# Patient Record
Sex: Male | Born: 2000 | Race: White | Hispanic: No | Marital: Single | Smoking: Never smoker
Health system: Southern US, Community
[De-identification: ages and names within clinical notes are randomized; demographics above are authoritative.]

## PROBLEM LIST (undated history)

## (undated) DIAGNOSIS — F909 Attention-deficit hyperactivity disorder, unspecified type: Secondary | ICD-10-CM

## (undated) DIAGNOSIS — F419 Anxiety disorder, unspecified: Secondary | ICD-10-CM

## (undated) DIAGNOSIS — I10 Essential (primary) hypertension: Secondary | ICD-10-CM

## (undated) DIAGNOSIS — Z8489 Family history of other specified conditions: Secondary | ICD-10-CM

## (undated) DIAGNOSIS — G4730 Sleep apnea, unspecified: Secondary | ICD-10-CM

## (undated) DIAGNOSIS — J45909 Unspecified asthma, uncomplicated: Secondary | ICD-10-CM

## (undated) HISTORY — DX: Anxiety disorder, unspecified: F41.9

## (undated) HISTORY — DX: Essential (primary) hypertension: I10

## (undated) HISTORY — DX: Attention-deficit hyperactivity disorder, unspecified type: F90.9

## (undated) HISTORY — PX: OTHER SURGICAL HISTORY: SHX169

---
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
## 2020-01-09 ENCOUNTER — Ambulatory Visit: Payer: BC Managed Care – PPO | Admitting: Psychiatry

## 2020-01-17 ENCOUNTER — Ambulatory Visit (INDEPENDENT_AMBULATORY_CARE_PROVIDER_SITE_OTHER): Payer: BC Managed Care – PPO | Admitting: Psychiatry

## 2020-01-17 ENCOUNTER — Encounter: Payer: Self-pay | Admitting: Psychiatry

## 2020-01-17 ENCOUNTER — Other Ambulatory Visit: Payer: Self-pay

## 2020-01-17 VITALS — Ht 71.0 in | Wt 250.0 lb

## 2020-01-17 DIAGNOSIS — F324 Major depressive disorder, single episode, in partial remission: Secondary | ICD-10-CM | POA: Diagnosis not present

## 2020-01-17 DIAGNOSIS — F4010 Social phobia, unspecified: Secondary | ICD-10-CM

## 2020-01-17 DIAGNOSIS — F800 Phonological disorder: Secondary | ICD-10-CM

## 2020-01-17 DIAGNOSIS — F902 Attention-deficit hyperactivity disorder, combined type: Secondary | ICD-10-CM | POA: Diagnosis not present

## 2020-01-17 DIAGNOSIS — F422 Mixed obsessional thoughts and acts: Secondary | ICD-10-CM | POA: Diagnosis not present

## 2020-01-17 DIAGNOSIS — F82 Specific developmental disorder of motor function: Secondary | ICD-10-CM

## 2020-01-17 MED ORDER — DEXMETHYLPHENIDATE HCL ER 40 MG PO CP24: 40.0000 mg | ORAL_CAPSULE | Freq: Every day | ORAL | 0 refills | Status: DC

## 2020-01-17 MED ORDER — CLONIDINE HCL 0.2 MG PO TABS: 0.2000 mg | ORAL_TABLET | Freq: Every day | ORAL | 0 refills | Status: DC

## 2020-01-17 MED ORDER — ESCITALOPRAM OXALATE 20 MG PO TABS: 30.0000 mg | ORAL_TABLET | Freq: Every day | ORAL | 0 refills | Status: DC

## 2020-01-17 NOTE — Patient Instructions (Signed)
Discontinue Tlilafon (perphenazine) 4 mg nightly

## 2020-01-17 NOTE — Progress Notes (Signed)
[facility]  Patient ID: Richard Kelley,  MRN: [id number]  PCP: Assunta Found, MD  Date of Evaluation: 01/17/2020 Time spent:25 minutes from 1410 to 1435  Chief Complaint:  Chief Complaint    Anxiety; ADHD; Depression      HISTORY/CURRENT STATUS: Richard Kelley is seen onsite in office 25 minutes face-to-face individually and conjointly with mother arriving 10 minutes early with consent with epic collateral for adolescent psychiatric interview and exam in 53-month evaluation and management of social anxiety, OCD/ADHD, depression now partially remitted and speech articulation and coordination limitations.  Mother states the patient is adjusting successfully to not going back to school but taking the semester off this winter and spring and will consider restarting RCC in August.  He is comfortable off of school, expecting he will need to resume his psychotherapy when he restarts school last therapy session with [name], LCSW on January 7 overdue to return allowing me to suggest that he resume before returning to school in order to prepare.  His weight gain must consider stopping the Trilafon for suicidal depression of late last fall and early winter which has actually subsided including risk of self-harm now resolved.  He is having some difficulty initiating sleep even with Trilafon so that as we discontinue it. He needs to be restarted on clonidine, needing more than the 0.1 mg to work in the past.  He is not taking the Focalin significantly though last dispensedper [place] registry 11/30/2019 as the second eScription from 11/0/2020.  He still has trouble sleeping.  He has few responsibilities now, but he and mother are both receiving more care in this office, mother seeing [name].  Brother is growing taller than Shelby now who has been at 71 inches or 72 with shoes for the last couple of years, gaining from 210 to 250 pounds now.  He has no mania, suicidality, psychosis or delirium.  He is learning mostly  from experience.  However he did not get his learner's permit renewed so that he has no time or experience coded to get his driver's license permit to again becomes motivated to do so.  Anxiety Presents forfollow-upvisit. Symptoms includeovereating, compulsions,decreased concentration,excessive worry,insomnia,muscle tension,nervous/anxious behavior, confusion,avoidance, inhibition,and obsessions. Patient reports nohyperventilation,chest pain, depressed mood,dizziness,dry mouth, derealization,palpitations, feeling of choking,irritability,panic,shortness of breathor suicidal ideas. Symptoms occurmost days. The severity of symptoms isinterfering with daily activities and moderate. The quality of sleep ispoor. Nighttime awakenings:several. Compliance with medications is76-100%. Side effects of treatment includejoint pain.  Individual Medical History/ Review of Systems: Changes? :Yes weight gain of 25 pounds in 18 months mother inactivity, compulsive eating, or Trilafon.  Allergies: Patient has no known allergies.  Current Medications:  Current Outpatient Medications:  .  cloNIDine (CATAPRES) 0.2 MG tablet, Take 1 tablet (0.2 mg total) by mouth at bedtime., Disp: 90 tablet, Rfl: 0 .  Dexmethylphenidate HCl 40 MG CP24, Take 1 capsule (40 mg total) by mouth daily after breakfast., Disp: 30 capsule, Rfl: 0 .  [START ON 02/16/2020] Dexmethylphenidate HCl 40 MG CP24, Take 1 capsule (40 mg total) by mouth daily after breakfast., Disp: 30 capsule, Rfl: 0 .  [START ON 03/17/2020] Dexmethylphenidate HCl 40 MG CP24, Take 1 capsule (40 mg total) by mouth daily after breakfast., Disp: 30 capsule, Rfl: 0 .  escitalopram (LEXAPRO) 20 MG tablet, Take 1.5 tablets (30 mg total) by mouth daily after breakfast., Disp: 135 tablet, Rfl: 0  Medication Side Effects: weight gain  Family Medical/ Social History: Changes? No  MENTAL HEALTH EXAM:  Height  5\' 11"  (1.803 m), weight 250 lb  (113.4 kg).Body mass index is 34.87 kg/m. Muscle strengths and tone 5/5, postural reflexes and gait 0/0, and AIMS = 0.  General Appearance: Casual, Fairly Groomed, Guarded, Meticulous and Obese  Eye Contact:  Fair  Speech:  Clear and Coherent, Garbled, Normal Rate and Talkative  Volume:  Normal  Mood:  Anxious, Dysphoric, Euthymic and Worthless  Affect:  Congruent, Inappropriate, Restricted and Anxious  Thought Process:  Coherent, Goal Directed, Irrelevant, Linear and Descriptions of Associations: Circumstantial  Orientation:  Full (Time, Place, and Person)  Thought Content: Ilusions, Obsessions and Rumination   Suicidal Thoughts:  No  Homicidal Thoughts:  No  Memory:  Immediate;   Good Remote;   Good  Judgement:  Fair  Insight:  Fair and Lacking  Psychomotor Activity:  Normal and Mannerisms  Concentration:  Concentration: Fair and Attention Span: Fair  Recall:  [employer] of Knowledge: Good  Language: Good  Assets:  Desire for Improvement Leisure Time Resilience Talents/Skills  ADL's:  Intact  Cognition: WNL  Prognosis:  Fair    DIAGNOSES:    ICD-10-CM   1. Mixed obsessional thoughts and acts  F42.2 escitalopram (LEXAPRO) 20 MG tablet  2. Social anxiety disorder  F40.10 escitalopram (LEXAPRO) 20 MG tablet  3. Attention deficit hyperactivity disorder, combined type  F90.2 cloNIDine (CATAPRES) 0.2 MG tablet    Dexmethylphenidate HCl 40 MG CP24    Dexmethylphenidate HCl 40 MG CP24    Dexmethylphenidate HCl 40 MG CP24  4. Major depression single episode, in partial remission ([place])  F32.4   5. 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  With weight gain and global improvement, Trilafon  is stopped 4 mg nightly though he suggests he is not taking any medication regularly currently.  The discontinued Trilafon is replaced for obsessional anxiety with an increased dose of Lexapro 20 mg tablet taking 1.5 tablets total 30 mg every morning #135 tablets no refill sent to [facility] on [address].  We will restart his clonidine at the double dose of 0.2 mg every bedtime for insomnia symptoms #90 with no refill to [facility] on [address].  He is E scribed the Focalin 40 mg XR every morning last in [place] registry 11/30/2019 as second of the escriptions from 07/12/2019 as #30 with no refill for to [facility] in [place] on [address] each for May 20, June 19, and July 19.  Psychosupportive psychoeducation reworks prevention and monitoring and safety hygiene integrated with cognitive behavioral goals for symptom treatment matching.  He returns for follow-up in 3 to 4 months or sooner if needed as mother explains to him today the hereditary patterns in the family for such symptom formation.   Delight Hoh, MD

## 2020-01-31 DIAGNOSIS — Z0189 Encounter for other specified special examinations: Secondary | ICD-10-CM | POA: Diagnosis not present

## 2020-01-31 DIAGNOSIS — F4010 Social phobia, unspecified: Secondary | ICD-10-CM | POA: Diagnosis not present

## 2020-01-31 DIAGNOSIS — F902 Attention-deficit hyperactivity disorder, combined type: Secondary | ICD-10-CM | POA: Diagnosis not present

## 2020-01-31 DIAGNOSIS — F422 Mixed obsessional thoughts and acts: Secondary | ICD-10-CM | POA: Diagnosis not present

## 2020-02-01 DIAGNOSIS — Z0000 Encounter for general adult medical examination without abnormal findings: Secondary | ICD-10-CM | POA: Diagnosis not present

## 2020-02-06 DIAGNOSIS — Z0001 Encounter for general adult medical examination with abnormal findings: Secondary | ICD-10-CM | POA: Diagnosis not present

## 2020-02-20 DIAGNOSIS — J343 Hypertrophy of nasal turbinates: Secondary | ICD-10-CM | POA: Diagnosis not present

## 2020-02-20 DIAGNOSIS — J310 Chronic rhinitis: Secondary | ICD-10-CM | POA: Diagnosis not present

## 2020-02-20 DIAGNOSIS — J342 Deviated nasal septum: Secondary | ICD-10-CM | POA: Diagnosis not present

## 2020-02-20 DIAGNOSIS — G4733 Obstructive sleep apnea (adult) (pediatric): Secondary | ICD-10-CM | POA: Diagnosis not present

## 2020-02-20 DIAGNOSIS — R040 Epistaxis: Secondary | ICD-10-CM | POA: Diagnosis not present

## 2020-02-21 DIAGNOSIS — G4459 Other complicated headache syndrome: Secondary | ICD-10-CM | POA: Diagnosis not present

## 2020-02-26 ENCOUNTER — Other Ambulatory Visit (HOSPITAL_COMMUNITY): Payer: Self-pay | Admitting: Internal Medicine

## 2020-02-26 ENCOUNTER — Other Ambulatory Visit: Payer: Self-pay | Admitting: Internal Medicine

## 2020-02-26 DIAGNOSIS — G4459 Other complicated headache syndrome: Secondary | ICD-10-CM

## 2020-02-27 ENCOUNTER — Ambulatory Visit (HOSPITAL_COMMUNITY): Admission: RE | Admit: 2020-02-27 | Payer: BC Managed Care – PPO | Source: Ambulatory Visit

## 2020-02-28 ENCOUNTER — Ambulatory Visit (HOSPITAL_COMMUNITY)
Admission: RE | Admit: 2020-02-28 | Discharge: 2020-02-28 | Disposition: A | Discharge status: Home / Self Care | Payer: BC Managed Care – PPO | Source: Ambulatory Visit | Attending: Internal Medicine | Admitting: Internal Medicine

## 2020-02-28 ENCOUNTER — Other Ambulatory Visit: Payer: Self-pay

## 2020-02-28 DIAGNOSIS — G4459 Other complicated headache syndrome: Secondary | ICD-10-CM | POA: Insufficient documentation

## 2020-02-28 DIAGNOSIS — R519 Headache, unspecified: Secondary | ICD-10-CM | POA: Diagnosis not present

## 2020-03-19 ENCOUNTER — Ambulatory Visit (HOSPITAL_COMMUNITY): Payer: Managed Care, Other (non HMO)

## 2020-04-03 DIAGNOSIS — G4719 Other hypersomnia: Secondary | ICD-10-CM | POA: Diagnosis not present

## 2020-04-16 DIAGNOSIS — R000 Tachycardia, unspecified: Secondary | ICD-10-CM | POA: Diagnosis not present

## 2020-04-17 ENCOUNTER — Encounter: Payer: Self-pay | Admitting: Cardiology

## 2020-04-17 DIAGNOSIS — R000 Tachycardia, unspecified: Secondary | ICD-10-CM | POA: Diagnosis not present

## 2020-04-17 DIAGNOSIS — R002 Palpitations: Secondary | ICD-10-CM | POA: Diagnosis not present

## 2020-04-22 ENCOUNTER — Telehealth: Payer: Self-pay

## 2020-04-22 DIAGNOSIS — J343 Hypertrophy of nasal turbinates: Secondary | ICD-10-CM | POA: Diagnosis not present

## 2020-04-22 DIAGNOSIS — J310 Chronic rhinitis: Secondary | ICD-10-CM | POA: Diagnosis not present

## 2020-04-22 DIAGNOSIS — J342 Deviated nasal septum: Secondary | ICD-10-CM | POA: Diagnosis not present

## 2020-04-22 NOTE — Telephone Encounter (Signed)
Prior authorization submitted and approved for DEXMETHYLPHENIDATE ER 40 MG CAPSULES effective 04/22/2020-04/22/2023 with [employer].

## 2020-04-25 ENCOUNTER — Other Ambulatory Visit (HOSPITAL_COMMUNITY): Payer: Self-pay | Admitting: Internal Medicine

## 2020-04-25 ENCOUNTER — Other Ambulatory Visit: Payer: Self-pay | Admitting: Internal Medicine

## 2020-05-19 ENCOUNTER — Ambulatory Visit: Payer: BC Managed Care – PPO | Admitting: Psychiatry

## 2020-05-19 ENCOUNTER — Encounter: Payer: Self-pay | Admitting: *Deleted

## 2020-05-20 ENCOUNTER — Ambulatory Visit (INDEPENDENT_AMBULATORY_CARE_PROVIDER_SITE_OTHER): Payer: BC Managed Care – PPO

## 2020-05-20 ENCOUNTER — Ambulatory Visit: Payer: BC Managed Care – PPO | Admitting: Cardiology

## 2020-05-20 ENCOUNTER — Encounter: Payer: Self-pay | Admitting: *Deleted

## 2020-05-20 ENCOUNTER — Telehealth: Payer: Self-pay | Admitting: Cardiology

## 2020-05-20 ENCOUNTER — Encounter: Payer: Self-pay | Admitting: Cardiology

## 2020-05-20 VITALS — BP 122/78 | HR 102 | Ht 71.0 in | Wt 256.0 lb

## 2020-05-20 DIAGNOSIS — R000 Tachycardia, unspecified: Secondary | ICD-10-CM | POA: Diagnosis not present

## 2020-05-20 DIAGNOSIS — R0602 Shortness of breath: Secondary | ICD-10-CM

## 2020-05-20 NOTE — Progress Notes (Signed)
Cardiology Office Note  Date: 05/20/2020   ID: Richard Kelley, DOB March 09, 2001, MRN [id number]  PCP:  Benita Stabile, MD  Cardiologist:  Nona Dell, MD Electrophysiologist:  None   Chief Complaint  Patient presents with   Shortness of Breath   Elevated heart rate    History of Present Illness: Richard Kelley is a 19 y.o. male referred for cardiology consultation by Dr. Margo Aye for evaluation of elevated heart rate and history of hypertension.  I reviewed the available records.  He is here today with his mother.  He has a history of ADHD and anxiety, currently followed by a psychiatrist and is on dexmethylphenidate as well as Lexapro, listed below.  Over the last few years he states that he has been told his heart rate is elevated when his blood pressure has been checked, although he does not report any definite sense of palpitations.  He does get short of breath when he exerts himself sometimes and has to rest, such as walking less than 100 yards.  He has had no syncope.  He is on clonidine not for treatment of hypertension, but to help him sleep per discussion today.  He also reports snoring, has been assessed by an ENT with documented a deviated septum and plan for potential surgical correction.  Also pending formal sleep study although this is on hold at this time.  He does not have any history of congenital heart disease, his mother tells me that there was initial concern that he might have a "hole in the heart," however this was not found to be the case by ultrasound imaging.  I reviewed lab work that he had done back in June, hemoglobin normal, renal function and potassium normal, TSH normal.  Orthostatic measurements made today did not show diagnostic hypotension, nor was there a significant enough increase in heart rate to diagnose POTS (only 20 point increase as opposed to a 30-40 point increase which would be diagnostic at this age).   Past Medical History:  Diagnosis Date    ADHD    Anxiety    Hypertension     Past Surgical History:  Procedure Laterality Date   No prior surgery      Current Outpatient Medications  Medication Sig Dispense Refill   cloNIDine (CATAPRES) 0.2 MG tablet Take 0.2 mg by mouth. DOESN'T TAKE REGULARLY     Dexmethylphenidate HCl 40 MG CP24 Take 1 capsule (40 mg total) by mouth daily after breakfast. 30 capsule 0   escitalopram (LEXAPRO) 20 MG tablet Take 20 mg by mouth daily.     No current facility-administered medications for this visit.   Allergies:  Patient has no known allergies.   Social History: The patient  reports that he has never smoked. He has never used smokeless tobacco. He reports that he does not drink alcohol and does not use drugs.   Family History: The patient's family history includes Hypertension in his maternal grandmother; Irregular heart beat in his father and mother.   ROS:  No orthopnea or PND, no leg swelling.  Physical Exam: VS:  BP 122/78    Pulse (!) 102    Ht 5\' 11"  (1.803 m)    Wt 256 lb (116.1 kg)    SpO2 98%    BMI 35.70 kg/m , BMI Body mass index is 35.7 kg/m.  Wt Readings from Last 3 Encounters:  05/20/20 256 lb (116.1 kg) (>99 %, Z= 2.51)*   * Growth percentiles are based  on CDC (Boys, 2-20 Years) data.    General: Obese male, appears comfortable at rest. HEENT: Conjunctiva and lids normal, no mass. Neck: Supple, no elevated JVP or carotid bruits, no thyromegaly. Lungs: Clear to auscultation, nonlabored breathing at rest. Cardiac: Regular rate and rhythm, no S3 or significant systolic murmur, no pericardial rub. Abdomen: Soft, nontender, bowel sounds present. Extremities: No pitting edema, distal pulses 2+. Skin: Warm and dry. Musculoskeletal: No kyphosis. Neuropsychiatric: Alert and oriented x3, affect grossly appropriate.  ECG:  An ECG dated 04/17/2020 was personally reviewed today and demonstrated:  Sinus tachycardia with normal intervals.  Recent Labwork:  June 2021:  Hgb 17.0, platelets 316, BUN 8, creatinine 0.92, potassium 4.2, AST 26, ALT 39, LDL 119, HDL 38, TG 162, cholesterol 186, T4 1.26, TSH 1.470  Other Studies Reviewed Today:  No prior cardiac testing for review today.  Assessment and Plan:  1.  Elevated heart rate, sinus tachycardia by ECG.  He does not report any sense of palpitations.  Does experience dyspnea on exertion at times, no orthopnea or PND.  Lab work from June showed normal hemoglobin, normal renal function and potassium, normal TSH.  Orthostatic measurements do not meet diagnostic criteria for POTS at this time.  It could be that his current medications, specifically dexmethylphenidate and Lexapro, contribute to his elevated heart rate and perhaps fluctuations in blood pressure as well.  Today's blood pressure is normal.  To investigate further we will plan an echocardiogram to ensure normal cardiac structure and function, obtain a 24-hour cardiac monitor to more clearly define heart rate variability and exclude persistent tachycardia, also a 24-hour urine collection for metanephrines and normetanephrines.  2.  ADHD and anxiety, currently on dexmethylphenidate and Lexapro with follow-up by psychiatrist.  Medication Adjustments/Labs and Tests Ordered: Current medicines are reviewed at length with the patient today.  Concerns regarding medicines are outlined above.   Tests Ordered: Orders Placed This Encounter  Procedures   Metanephrines, urine, 24 hour   LONG TERM MONITOR (3-14 DAYS)   ECHOCARDIOGRAM COMPLETE    Medication Changes: No orders of the defined types were placed in this encounter.   Disposition:  Follow up test results.  Signed, Jonelle Sidle, MD, [facility] 05/20/2020 11:31 AM    [facility] [address] [place], [place], [place] [address] Phone: [phone]; Fax: [phone]

## 2020-05-20 NOTE — Telephone Encounter (Signed)
Pre-cert Verification for the following procedure    ZIO MONITOR   Echo scheduled for 05-21-2020 at [facility]

## 2020-05-20 NOTE — Patient Instructions (Addendum)
Medication Instructions:   Your physician recommends that you continue on your current medications as directed. Please refer to the Current Medication list given to you today.  Labwork:  Your physician recommends that you return for lab work in: as soon as possible to check a 24 hour urine metanephrines (normetaneprhines). This may be done at [facility] or [employer] [facility] ([address] [place]) Monday-Friday from 8:00 am - 4:00 pm. No appointment is needed.  Testing/Procedures: Your physician has requested that you have an echocardiogram. Echocardiography is a painless test that uses sound waves to create images of your heart. It provides your doctor with information about the size and shape of your heart and how well your heart's chambers and valves are working. This procedure takes approximately one hour. There are no restrictions for this procedure. Your physician has recommended that you wear a holter monitor for 24 hours. Holter monitors are medical devices that record the heart's electrical activity. Doctors most often use these monitors to diagnose arrhythmias. Arrhythmias are problems with the speed or rhythm of the heartbeat. The monitor is a small, portable device. You can wear one while you do your normal daily activities. This is usually used to diagnose what is causing palpitations/syncope (passing out).   Follow-Up:  Your physician recommends that you schedule a follow-up appointment in: pending.   Any Other Special Instructions Will Be Listed Below (If Applicable).  If you need a refill on your cardiac medications before your next appointment, please call your pharmacy.

## 2020-05-21 ENCOUNTER — Ambulatory Visit (INDEPENDENT_AMBULATORY_CARE_PROVIDER_SITE_OTHER): Payer: BC Managed Care – PPO

## 2020-05-21 DIAGNOSIS — R0602 Shortness of breath: Secondary | ICD-10-CM | POA: Diagnosis not present

## 2020-05-21 LAB — ECHOCARDIOGRAM COMPLETE
Area-P 1/2: 4.17 cm2
Calc EF: 61.7 %
MV M vel: 1.98 m/s
MV Peak grad: 15.7 mmHg
S' Lateral: 2.64 cm
Single Plane A2C EF: 59.2 %
Single Plane A4C EF: 64.2 %

## 2020-05-22 ENCOUNTER — Ambulatory Visit (INDEPENDENT_AMBULATORY_CARE_PROVIDER_SITE_OTHER): Payer: BC Managed Care – PPO | Admitting: Psychiatry

## 2020-05-22 ENCOUNTER — Encounter: Payer: Self-pay | Admitting: Psychiatry

## 2020-05-22 ENCOUNTER — Other Ambulatory Visit: Payer: Self-pay

## 2020-05-22 VITALS — Ht 71.0 in | Wt 250.0 lb

## 2020-05-22 DIAGNOSIS — F324 Major depressive disorder, single episode, in partial remission: Secondary | ICD-10-CM | POA: Diagnosis not present

## 2020-05-22 DIAGNOSIS — F902 Attention-deficit hyperactivity disorder, combined type: Secondary | ICD-10-CM

## 2020-05-22 DIAGNOSIS — F4010 Social phobia, unspecified: Secondary | ICD-10-CM | POA: Diagnosis not present

## 2020-05-22 DIAGNOSIS — F800 Phonological disorder: Secondary | ICD-10-CM

## 2020-05-22 DIAGNOSIS — F422 Mixed obsessional thoughts and acts: Secondary | ICD-10-CM

## 2020-05-22 DIAGNOSIS — F82 Specific developmental disorder of motor function: Secondary | ICD-10-CM

## 2020-05-22 MED ORDER — CLONIDINE HCL 0.2 MG PO TABS: 0.2000 mg | ORAL_TABLET | Freq: Every day | ORAL | 1 refills | Status: DC

## 2020-05-22 MED ORDER — DEXMETHYLPHENIDATE HCL ER 30 MG PO CP24: 30.0000 mg | ORAL_CAPSULE | Freq: Every day | ORAL | 0 refills | Status: DC

## 2020-05-22 MED ORDER — ESCITALOPRAM OXALATE 20 MG PO TABS: 20.0000 mg | ORAL_TABLET | Freq: Every day | ORAL | 1 refills | Status: DC

## 2020-05-22 NOTE — Progress Notes (Signed)
[facility]  Patient ID: Richard Kelley,  MRN: [id number]  PCP: Richard Stabile, MD  Date of Evaluation: 05/22/2020 Time spent:25 minutes from 1505 to 1530  Chief Complaint:  Chief Complaint    Anxiety; Paranoid; ADHD; Depression      HISTORY/CURRENT STATUS: Richard Kelley is seen onsite in office 25 minutes face-to-face individually with consent with epic collateral arriving 20 minutes early therefore seen early as the person before him was late mother having an appointment with Richard Kelley at the same time not participating with Richard Kelley as they wish him to become more individuated for psychiatric interview and exam in 24-month evaluation and management of OCD/ADHD, social anxiety, single episode previously suicidal major depression in partial remission, and speech sound and developmental coordination disorders.  The patient reports he is doing well but he attributes that to taking last semester off RCC currently only gradually reinstating by completing equivalence certificates for courses on website.  He feels no need for therapy after 4 sessions with Richard Shutter, LCSW last 09/12/2019.  Depression improved with Trilafon 4 mg and Lexapro 20 mg tapering and then discontinuing Trilafon by last appointment 01/17/2020 for weight gain and less initiative. He still does not help mother with chores even off Trilafon.  He continues clonidine at bedtime at increased dose of 0.2 mg and Lexapro 20 mg every morning.  He requires that Focalin will be reduced from 40 to 30 mg as he cannot sleep well if he takes the Focalin later in the morning at 40 mg dose therefore he will just skip the dose.  He is in the middle of rather extensive medical work-ups to see what treatment is needed for resting tachycardia stating his most normal heart rate is when he is angry enough to do something about it.  He notes the cardiologist is clarifying that all of the patient's medications could speed up his heart rate. Echocardiogram is normal  and results of Holter for 24 hours are pending.  ENT is considering nasal septal surgery and he will have a sleep study.  Richard Kelley registry documents last Focalin 40 mg XR dispensed 04/23/2020 from escription 11/30/2019 soon to expire.  Previous consideration of social communication disorder or autism diagnoses have in the interim no further reason for pursuing as he is considered to function more normally when stress is low.  For my upcoming imminent retirement in 3 months, he may possibly see Richard Kelley as mother sees Richard Kelley.   Anxiety Presents forfollow-upvisit. Symptoms includeremitting dysphoria, overeating, compulsive rituals,decreased concentration,excessive worry,insomnia,muscle tension,nervous/anxious behavior, confusion,avoidance, inhibition,and obsessions. Patient reports nohyperventilation,chest pain,depressed mood,dizziness,dry mouth, derealization,palpitations,feeling of choking,irritability,panic,shortness of breathor suicidal ideas. Symptoms occurmost days. The severity of symptoms isinterfering with daily activities and moderate. The quality of sleep ispoor. Nighttime awakenings:several. Compliance with medications is76-100%. Side effects of treatment includejoint pain.  Individual Medical History/ Review of Systems: Changes? :Yes   Allergies: Patient has no known allergies.  Current Medications:  Current Outpatient Medications:  .  cloNIDine (CATAPRES) 0.2 MG tablet, Take 1 tablet (0.2 mg total) by mouth at bedtime. DOESN'T TAKE REGULARLY, Disp: 90 tablet, Rfl: 1 .  Dexmethylphenidate HCl 30 MG CP24, Take 1 capsule (30 mg total) by mouth daily after breakfast., Disp: 30 capsule, Rfl: 0 .  [START ON 06/21/2020] Dexmethylphenidate HCl 30 MG CP24, Take 1 capsule (30 mg total) by mouth daily after breakfast., Disp: 30 capsule, Rfl: 0 .  [START ON 07/21/2020] Dexmethylphenidate HCl 30 MG CP24, Take 1 capsule (30 mg total) by mouth daily after breakfast.,  Disp:  30 capsule, Rfl: 0 .  escitalopram (LEXAPRO) 20 MG tablet, Take 1 tablet (20 mg total) by mouth daily after breakfast., Disp: 90 tablet, Rfl: 1  Medication Side Effects: weight gain and tachycardia  Family Medical/ Social History: Changes? No  MENTAL HEALTH EXAM:  Height 5\' 11"  (1.803 m), weight 250 lb (113.4 kg).Body mass index is 34.87 kg/m. Muscle strengths and tone 5/5, postural reflexes and gait 0/0, and AIMS = 0.  General Appearance: Casual, Fairly Groomed, Guarded, Meticulous and Obese  Eye Contact:  Fair  Speech:  Clear and Coherent, Garbled, Normal Rate and Talkative  Volume:  Normal  Mood:  Anxious, Euthymic and Worthless  Affect:  Congruent, Inappropriate, Restricted and Anxious  Thought Process:  Coherent, Goal Directed, Irrelevant, Linear and Descriptions of Associations: Circumstantial  Orientation:  Full (Time, Place, and Person)  Thought Content: Ilusions, Obsessions and Rumination   Suicidal Thoughts:  No  Homicidal Thoughts:  No  Memory:  Immediate;   Good Remote;   Good  Judgement:  Fair  Insight:  Fair and Lacking  Psychomotor Activity:  Normal, Decreased and Mannerisms  Concentration:  Concentration: Fair and Attention Span: Fair  Recall:  of Knowledge: Good  Language: Fair  Assets:  Desire for Improvement Leisure Time Resilience Talents/Skills  ADL's:  Intact  Cognition: WNL  Prognosis:  Fair    DIAGNOSES:    ICD-10-CM   1. Mixed obsessional thoughts and acts  F42.2 escitalopram (LEXAPRO) 20 MG tablet  2. Social anxiety disorder  F40.10 escitalopram (LEXAPRO) 20 MG tablet  3. Attention deficit hyperactivity disorder, combined type  F90.2 Dexmethylphenidate HCl 30 MG CP24    Dexmethylphenidate HCl 30 MG CP24    Dexmethylphenidate HCl 30 MG CP24    cloNIDine (CATAPRES) 0.2 MG tablet  4. 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  Developmental coordination disorder  F82 cloNIDine (CATAPRES) 0.2 MG tablet    Receiving Psychotherapy: No    RECOMMENDATIONS: Patient without mother concludes to reduce Focalin but maintain Lexapro and clonidine without therapy as he actually establishes some academic and family responsibilities.  Focalin 40 mg is reduced to 30 mg XR capsule every morning meal for September 23, October 23, and November 22 sent to November [address] on [employer] for ADHD.  Lexapro is currently 20 mg every morning after breakfast sent as #90 with 1 refill to [facility] on [address] for anxiety, OCD, and depression.  He is E scribed clonidine 0.2 mg every bedtime #90 with 1 refill for insomnia associated with anxiety as well as ADHD sent to [facility] on [address].  Follow-up has been every 4 months or sooner if needed with closure of my care for imminent retirement to see advanced practitioner for next session.    [employer], MD

## 2020-05-23 ENCOUNTER — Telehealth: Payer: Self-pay | Admitting: *Deleted

## 2020-05-23 NOTE — Telephone Encounter (Signed)
-----   Message from Jonelle Sidle, MD sent at 05/21/2020  1:59 PM EDT ----- Results reviewed.  Echocardiogram is reassuring with normal LVEF at 65 to 70%, no evidence of cardiomyopathy which was our main question with elevated heart rate.  No significant valvular abnormalities.

## 2020-05-23 NOTE — Telephone Encounter (Signed)
Patient's mom [facility] [place]) informed. Copy sent to PCP

## 2020-05-27 DIAGNOSIS — R0602 Shortness of breath: Secondary | ICD-10-CM | POA: Diagnosis not present

## 2020-05-27 DIAGNOSIS — R000 Tachycardia, unspecified: Secondary | ICD-10-CM | POA: Diagnosis not present

## 2020-05-29 ENCOUNTER — Telehealth: Payer: Self-pay | Admitting: *Deleted

## 2020-05-29 NOTE — Telephone Encounter (Signed)
Patient mom [facility] [place]) informed and verbalized understanding of plan. Copy sent to PCP

## 2020-05-29 NOTE — Telephone Encounter (Signed)
-----   Message from Jonelle Sidle, MD sent at 05/29/2020  1:22 PM EDT ----- Results reviewed.  Cardiac monitor shows reduction in heart rate below 100 predominantly during early morning hours down to a low of 61 bpm.  Highest heart rates are during the daytime while awake.  This excludes persistent sinus tachycardia which was the main question.  No significant arrhythmias were noted.

## 2020-05-30 LAB — METANEPHRINES, URINE, 24 HOUR
Metaneph Total, Ur: 1044 mcg/24 h — ABNORMAL HIGH (ref 94–604)
Metanephrines, Ur: 223 mcg/24 h — ABNORMAL HIGH (ref 25–222)
Normetanephrine, 24H Ur: 821 mcg/24 h — ABNORMAL HIGH (ref 40–412)
Volume, Urine-VMAUR: 1350 mL

## 2020-06-02 ENCOUNTER — Other Ambulatory Visit: Payer: Self-pay

## 2020-06-02 DIAGNOSIS — R000 Tachycardia, unspecified: Secondary | ICD-10-CM

## 2020-06-03 DIAGNOSIS — R000 Tachycardia, unspecified: Secondary | ICD-10-CM | POA: Diagnosis not present

## 2020-06-18 ENCOUNTER — Encounter: Payer: Self-pay | Admitting: Psychiatry

## 2020-06-18 ENCOUNTER — Other Ambulatory Visit: Payer: BC Managed Care – PPO

## 2020-06-24 ENCOUNTER — Telehealth: Payer: Self-pay | Admitting: *Deleted

## 2020-06-24 NOTE — Telephone Encounter (Signed)
-----   Message from Jonelle Sidle, MD sent at 06/24/2020 10:44 AM EDT ----- Results reviewed.  Plasma normetanephrine and metanephrine levels are within normal range.  As noted previously Chromogranin A level was also normal.  This is reassuring and argues against anything unusual like a neuroendocrine tumor causing increasing heart rate.  We already reviewed his echocardiogram and cardiac monitor, also reassuring.  Would not anticipate any further cardiac work-up at this time.  Keep follow-up with PCP and behavioral health, regular exercise plan would also be beneficial.

## 2020-06-24 NOTE — Telephone Encounter (Signed)
Patient's mom informed. Copy sent to PCP

## 2020-09-18 ENCOUNTER — Telehealth (INDEPENDENT_AMBULATORY_CARE_PROVIDER_SITE_OTHER): Payer: BC Managed Care – PPO | Admitting: Physician Assistant

## 2020-09-18 ENCOUNTER — Telehealth: Payer: Self-pay | Admitting: Physician Assistant

## 2020-09-18 ENCOUNTER — Encounter: Payer: Self-pay | Admitting: Physician Assistant

## 2020-09-18 DIAGNOSIS — F320 Major depressive disorder, single episode, mild: Secondary | ICD-10-CM

## 2020-09-18 DIAGNOSIS — F324 Major depressive disorder, single episode, in partial remission: Secondary | ICD-10-CM

## 2020-09-18 DIAGNOSIS — F902 Attention-deficit hyperactivity disorder, combined type: Secondary | ICD-10-CM | POA: Diagnosis not present

## 2020-09-18 DIAGNOSIS — F82 Specific developmental disorder of motor function: Secondary | ICD-10-CM

## 2020-09-18 DIAGNOSIS — F4010 Social phobia, unspecified: Secondary | ICD-10-CM

## 2020-09-18 MED ORDER — DEXMETHYLPHENIDATE HCL ER 30 MG PO CP24: 30.0000 mg | ORAL_CAPSULE | Freq: Every day | ORAL | 0 refills | Status: DC

## 2020-09-18 MED ORDER — DEXMETHYLPHENIDATE HCL ER 30 MG PO CP24
30.0000 mg | ORAL_CAPSULE | Freq: Every day | ORAL | 0 refills | Status: DC
Start: 2020-10-18 — End: 2021-04-01

## 2020-09-18 MED ORDER — DEXMETHYLPHENIDATE HCL ER 30 MG PO CP24
30.0000 mg | ORAL_CAPSULE | Freq: Every day | ORAL | 0 refills | Status: DC
Start: 2020-11-14 — End: 2021-04-01

## 2020-09-18 NOTE — Telephone Encounter (Signed)
Mr. toluwani, yadav are scheduled for a virtual visit with your provider today.    Just as we do with appointments in the office, we must obtain your consent to participate.  Your consent will be active for this visit and any virtual visit you may have with one of our providers in the next 365 days.    If you have a MyChart account, I can also send a copy of this consent to you electronically.  All virtual visits are billed to your insurance company just like a traditional visit in the office.  As this is a virtual visit, video technology does not allow for your provider to perform a traditional examination.  This may limit your provider's ability to fully assess your condition.  If your provider identifies any concerns that need to be evaluated in person or the need to arrange testing such as labs, EKG, etc, we will make arrangements to do so.    Although advances in technology are sophisticated, we cannot ensure that it will always work on either your end or our end.  If the connection with a video visit is poor, we may have to switch to a telephone visit.  With either a video or telephone visit, we are not always able to ensure that we have a secure connection.   I need to obtain your verbal consent now.   Are you willing to proceed with your visit today?   Clyda Greener has provided verbal consent on 09/18/2020 for a virtual visit (video or telephone).   Melony Overly, PA-C 09/18/2020  3:10 PM

## 2020-09-18 NOTE — Progress Notes (Signed)
[facility]  Patient ID: Richard Kelley,  MRN: [id number]  PCP: Benita Stabile, MD  Date of Evaluation: 09/18/2020 Time spent:40 minutes  Chief Complaint:  Chief Complaint    ADHD; Anxiety     Virtual Visit via Telehealth  I connected with patient by a video enabled telemedicine application with their informed consent, and verified patient privacy and that I am speaking with the correct person using two identifiers.  I am private, in my office and the patient is at home.  I discussed the limitations, risks, security and privacy concerns of performing an evaluation and management service by video and the availability of in person appointments. I also discussed with the patient that there may be a patient responsible charge related to this service. The patient expressed understanding and agreed to proceed.   I discussed the assessment and treatment plan with the patient. The patient was provided an opportunity to ask questions and all were answered. The patient agreed with the plan and demonstrated an understanding of the instructions.   The patient was advised to call back or seek an in-person evaluation if the symptoms worsen or if the condition fails to improve as anticipated.  I provided 40 minutes of non-face-to-face time during this encounter.  HISTORY/CURRENT STATUS: HPI For routine med check. Transferring to my care from Dr. Beverly Milch, who retired recently. Mom is also in on the appt.  Needs Rx RF. Would like to decrease the Focalin. On the 40 mg, gives him palpitations.  States it helps, but the palpitations have not improved, after being on this dose for several months.  The palpitations occur within about 30 minutes of taking the medication and can last several hours.  He feels more jittery as well.  If he takes the Focalin too late in the day, he has trouble sleeping.  The clonidine does not always help that.  Social anxiety is somewhat better.  Depending on the  situation he excessively worries, especially in the evening and that prevent a good night's sleep.  No compulsive rituals, just the obsessive thoughts that he and his mom both report that overall the Lexapro is helping that problem.  Patient denies loss of interest in usual activities and is able to enjoy things.  Denies decreased energy or motivation.  Appetite has not changed.  No extreme sadness, tearfulness, or feelings of hopelessness. Denies suicidal or homicidal thoughts.  Patient denies increased energy with decreased need for sleep, no increased talkativeness, no racing thoughts, no impulsivity or risky behaviors, no increased spending, no increased libido, no grandiosity, no increased irritability or anger, no paranoia, and no hallucinations.  Denies dizziness, syncope, seizures, numbness, tingling, tremor, tics, unsteady gait, slurred speech, confusion. Denies muscle or joint pain, stiffness, or dystonia.  Individual Medical History/ Review of Systems: Changes? :No    Past medications for mental health diagnoses include: Adderall, Vyvanse caused depression, Ritalin, Lexapro, Wellbutrin made him dazed and disoriented, Clonodine, Perphenazine  Allergies: Patient has no known allergies.   Current Medications:  Current Outpatient Medications:  .  escitalopram (LEXAPRO) 20 MG tablet, Take 1 tablet (20 mg total) by mouth daily after breakfast., Disp: 90 tablet, Rfl: 1 .  cloNIDine (CATAPRES) 0.2 MG tablet, Take 1.5 tablets (0.3 mg total) by mouth at bedtime., Disp: 90 tablet, Rfl: 1 .  [START ON 11/14/2020] Dexmethylphenidate HCl 30 MG CP24, Take 1 capsule (30 mg total) by mouth daily after breakfast., Disp: 30 capsule, Rfl: 0 .  [START ON 10/18/2020]  Dexmethylphenidate HCl 30 MG CP24, Take 1 capsule (30 mg total) by mouth daily after breakfast., Disp: 30 capsule, Rfl: 0 .  Dexmethylphenidate HCl 30 MG CP24, Take 1 capsule (30 mg total) by mouth daily after breakfast., Disp: 30 capsule, Rfl:  0 Medication Side Effects: Palpitations, and insomnia if he takes the Focalin too late in the day.  Family Medical/ Social History: Changes? In school at [employer]  MENTAL HEALTH EXAM:  There were no vitals taken for this visit.There is no height or weight on file to calculate BMI.  General Appearance: Casual  Eye Contact:  Good  Speech:  Clear and Coherent and Normal Rate  Volume:  Normal  Mood:  Euthymic  Affect:  Appropriate  Thought Process:  Goal Directed and Descriptions of Associations: Intact  Orientation:  Full (Time, Place, and Person)  Thought Content: Logical   Suicidal Thoughts:  No  Homicidal Thoughts:  No  Memory:  WNL  Judgement:  Good  Insight:  Good  Psychomotor Activity:  Normal  Concentration:  Concentration: Good and Attention Span: Good  Recall:  Good  Fund of Knowledge: Good  Language: Good  Assets:  Desire for Improvement  ADL's:  Intact  Cognition: WNL  Prognosis:  Good    DIAGNOSES:    ICD-10-CM   1. Attention deficit hyperactivity disorder, combined type  F90.2 Dexmethylphenidate HCl 30 MG CP24    Dexmethylphenidate HCl 30 MG CP24    Dexmethylphenidate HCl 30 MG CP24    cloNIDine (CATAPRES) 0.2 MG tablet  2. Major depression single episode, in partial remission (HCC)  F32.4   3. Social anxiety disorder  F40.10   4. Mild major depression, single episode (HCC)  F32.0   5. Developmental coordination disorder  F82 cloNIDine (CATAPRES) 0.2 MG tablet    Receiving Psychotherapy: No    RECOMMENDATIONS:  PDMP was reviewed. I provided 40 minutes of nonface-to-face time during this encounter, including time spent before and after the appointment reviewing records.  During the appointment, we discussed decreasing the Focalin.  According to Dr. Marlyne Beards last note, that had already been done so I am not sure how the patient was still taking the 40 mg pills.  At any rate I will send in 3 new prescriptions for the 30 mg and see how he does.  I also recommend  increasing the clonidine.  The goal here is to improve sleep and decrease any anxiety.  He and his mom understand and will watch for hypotension symptoms such as dizziness or feeling faint.  Prevention of orthostasis was described. Continue Lexapro 20 mg 1 p.o. daily. Decrease Focalin to 30 mg p.o. daily. Increase clonidine 0.2 mg to 1.5 pills q. evening.  He knows not to run out of the medication.  He will call if he is getting close to running out and if the 0.3 mg is working, I can send in that dose.  He and his mom understand. Return in 4 to 6 weeks.  Melony Overly, PA-C

## 2020-09-21 MED ORDER — CLONIDINE HCL 0.2 MG PO TABS: 0.3000 mg | ORAL_TABLET | Freq: Every day | ORAL | 1 refills | Status: DC

## 2020-11-14 ENCOUNTER — Other Ambulatory Visit: Payer: Self-pay | Admitting: Psychiatry

## 2020-11-14 DIAGNOSIS — F82 Specific developmental disorder of motor function: Secondary | ICD-10-CM

## 2020-11-14 DIAGNOSIS — F902 Attention-deficit hyperactivity disorder, combined type: Secondary | ICD-10-CM

## 2020-11-14 DIAGNOSIS — F422 Mixed obsessional thoughts and acts: Secondary | ICD-10-CM

## 2020-11-14 DIAGNOSIS — F4010 Social phobia, unspecified: Secondary | ICD-10-CM

## 2020-11-14 DIAGNOSIS — F324 Major depressive disorder, single episode, in partial remission: Secondary | ICD-10-CM

## 2020-12-22 ENCOUNTER — Other Ambulatory Visit: Payer: Self-pay | Admitting: Otolaryngology

## 2021-01-20 ENCOUNTER — Other Ambulatory Visit: Payer: Self-pay

## 2021-01-20 ENCOUNTER — Ambulatory Visit (INDEPENDENT_AMBULATORY_CARE_PROVIDER_SITE_OTHER): Payer: BC Managed Care – PPO | Admitting: Physician Assistant

## 2021-01-20 ENCOUNTER — Encounter: Payer: Self-pay | Admitting: Physician Assistant

## 2021-01-20 VITALS — BP 125/76 | HR 76 | Ht 71.0 in | Wt 260.0 lb

## 2021-01-20 DIAGNOSIS — F902 Attention-deficit hyperactivity disorder, combined type: Secondary | ICD-10-CM

## 2021-01-20 DIAGNOSIS — F82 Specific developmental disorder of motor function: Secondary | ICD-10-CM

## 2021-01-20 DIAGNOSIS — F422 Mixed obsessional thoughts and acts: Secondary | ICD-10-CM

## 2021-01-20 DIAGNOSIS — F324 Major depressive disorder, single episode, in partial remission: Secondary | ICD-10-CM

## 2021-01-20 DIAGNOSIS — F4010 Social phobia, unspecified: Secondary | ICD-10-CM

## 2021-01-20 MED ORDER — ESCITALOPRAM OXALATE 20 MG PO TABS: 20.0000 mg | ORAL_TABLET | Freq: Every day | ORAL | 0 refills | Status: DC

## 2021-01-20 MED ORDER — CLONIDINE HCL 0.2 MG PO TABS: 0.4000 mg | ORAL_TABLET | Freq: Every evening | ORAL | 0 refills | Status: DC

## 2021-01-20 NOTE — Progress Notes (Signed)
[facility]  Patient ID: Richard Kelley,  MRN: [id number]  PCP: Benita Stabile, MD  Date of Evaluation: 01/20/2021 Time spent:20 minutes  Chief Complaint:  Chief Complaint    ADHD; Anxiety; Depression      HISTORY/CURRENT STATUS: HPI For routine med check. Was seeing Dr. Beverly Milch until his retirement recently. This is my 2nd visit with him, and 1st in person.   Only takes the  Focalin as needed. Not in school, or working. 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   He increased the Clonodine to 0.4 mg, thought it may help with focus as well as sleep.  It has helped some.  He has had no dizziness, known low blood pressure, or syncope.  Patient denies loss of interest in usual activities and is able to enjoy things.  Denies decreased energy or motivation.  Appetite has not changed.  No extreme sadness, tearfulness, or feelings of hopelessness.  Denies any changes in concentration, making decisions or remembering things.  Denies suicidal or homicidal thoughts.  States that attention is good without easy distractibility.  Able to focus on things and finish tasks to completion.   Denies dizziness, syncope, seizures, numbness, tingling, tremor, tics, unsteady gait, slurred speech, confusion. Denies muscle or joint pain, stiffness, or dystonia.  Individual Medical History/ Review of Systems: Changes? :No   Past medications for mental health diagnoses include: Adderall, Vyvanse caused depression, Ritalin, Lexapro, Wellbutrin made him dazed and disoriented, Clonodine, Perphenazine  Allergies: Patient has no known allergies.  Current Medications:  Current Outpatient Medications:  .  cloNIDine (CATAPRES) 0.2 MG tablet, Take 2 tablets (0.4 mg total) by mouth at bedtime., Disp: 180 tablet, Rfl: 0 .  Dexmethylphenidate HCl 30 MG CP24, Take 1 capsule (30 mg total) by mouth daily after breakfast., Disp: 30 capsule, Rfl: 0 .  Dexmethylphenidate HCl 30 MG CP24,  Take 1 capsule (30 mg total) by mouth daily after breakfast., Disp: 30 capsule, Rfl: 0 .  Dexmethylphenidate HCl 30 MG CP24, Take 1 capsule (30 mg total) by mouth daily after breakfast., Disp: 30 capsule, Rfl: 0 .  escitalopram (LEXAPRO) 20 MG tablet, Take 1 tablet (20 mg total) by mouth at bedtime., Disp: 90 tablet, Rfl: 0 Medication Side Effects: none  Family Medical/ Social History: Changes?  No  MENTAL HEALTH EXAM:  Blood pressure 125/76, pulse 76, height 5\' 11"  (1.803 m), weight 260 lb (117.9 kg).Body mass index is 36.26 kg/m.  General Appearance: Casual and Well Groomed  Eye Contact:  Good  Speech:  Clear and Coherent and Normal Rate  Volume:  Normal  Mood:  Euthymic  Affect:  Congruent  Thought Process:  Goal Directed and Descriptions of Associations: Circumstantial  Orientation:  Full (Time, Place, and Person)  Thought Content: Logical   Suicidal Thoughts:  No  Homicidal Thoughts:  No  Memory:  WNL  Judgement:  Good  Insight:  Good  Psychomotor Activity:  Normal  Concentration:  Concentration: Good  Recall:  Good  Fund of Knowledge: Good  Language: Good  Assets:  Desire for Improvement  ADL's:  Intact  Cognition: WNL  Prognosis:  Good    DIAGNOSES:    ICD-10-CM   1. Social anxiety disorder  F40.10 escitalopram (LEXAPRO) 20 MG tablet  2. Mixed obsessional thoughts and acts  F42.2 escitalopram (LEXAPRO) 20 MG tablet  3. Attention deficit hyperactivity disorder, combined type  F90.2 cloNIDine (CATAPRES) 0.2 MG tablet  4. Developmental coordination disorder  F82 cloNIDine (CATAPRES)  0.2 MG tablet  5. Major depression single episode, in partial remission (HCC)  F32.4 escitalopram (LEXAPRO) 20 MG tablet    Receiving Psychotherapy: No    RECOMMENDATIONS:  PDMP was reviewed. I provided 20 minutes of face-to-face time during this encounter, including time spent before and after the visit in records review, medication management, and charting. He is doing well so no  changes in medication doses are necessary. The clonidine at 0.4 mg is fine, however I reminded him to contact me before changing anything more that in the future. Continue clonidine 0.2 mg, 2 p.o. nightly. Continue Focalin 30 mg 1 p.o. every morning. Continue Lexapro 20 mg, 1 po qhs.  Return in 3 months.  Melony Overly, PA-C

## 2021-04-01 ENCOUNTER — Other Ambulatory Visit: Payer: Self-pay

## 2021-04-01 ENCOUNTER — Encounter: Payer: Self-pay | Admitting: Physician Assistant

## 2021-04-01 ENCOUNTER — Ambulatory Visit: Payer: BC Managed Care – PPO | Admitting: Physician Assistant

## 2021-04-01 DIAGNOSIS — F324 Major depressive disorder, single episode, in partial remission: Secondary | ICD-10-CM | POA: Diagnosis not present

## 2021-04-01 DIAGNOSIS — F4010 Social phobia, unspecified: Secondary | ICD-10-CM

## 2021-04-01 DIAGNOSIS — F422 Mixed obsessional thoughts and acts: Secondary | ICD-10-CM | POA: Diagnosis not present

## 2021-04-01 DIAGNOSIS — F82 Specific developmental disorder of motor function: Secondary | ICD-10-CM | POA: Diagnosis not present

## 2021-04-01 DIAGNOSIS — F902 Attention-deficit hyperactivity disorder, combined type: Secondary | ICD-10-CM

## 2021-04-01 MED ORDER — DEXMETHYLPHENIDATE HCL ER 30 MG PO CP24: 30.0000 mg | ORAL_CAPSULE | Freq: Every day | ORAL | 0 refills | Status: DC

## 2021-04-01 MED ORDER — CLONIDINE HCL 0.2 MG PO TABS: 0.2000 mg | ORAL_TABLET | Freq: Every evening | ORAL | 1 refills | Status: DC

## 2021-04-01 MED ORDER — ESCITALOPRAM OXALATE 20 MG PO TABS
20.0000 mg | ORAL_TABLET | Freq: Every day | ORAL | 1 refills | Status: DC
Start: 2021-04-01 — End: 2021-10-07

## 2021-04-01 NOTE — Progress Notes (Signed)
[facility]  Patient ID: Richard Kelley,  MRN: [id number]  PCP: Benita Stabile, MD  Date of Evaluation: 04/01/2021 Time spent:20 minutes  Chief Complaint:  Chief Complaint   ADHD; Follow-up      HISTORY/CURRENT STATUS: HPI For routine med check.  Former patient of Dr. Beverly Milch.  Only takes the  Focalin as needed. Not in school, or working. 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 Hasn't registered yet, 'it's not real if I don't have the classes scheduled.'  Able to enjoy things. Catching up with old friends over the summer. Energy and motivation are normal for him. Sleeps well unless chooses to stay up. Not isolating. Not crying easily. Not SI/HI.  Patient denies increased energy with decreased need for sleep, no increased talkativeness, no racing thoughts, no impulsivity or risky behaviors, no increased spending, no increased libido, no grandiosity, no increased irritability or anger, and no hallucinations.  States that attention is good without easy distractibility.  Able to focus on things and finish tasks to completion.   Denies dizziness, syncope, seizures, numbness, tingling, tremor, tics, unsteady gait, slurred speech, confusion. Denies muscle or joint pain, stiffness, or dystonia.  Individual Medical History/ Review of Systems: Changes? :No   Past medications for mental health diagnoses include: Adderall, Vyvanse caused depression, Ritalin, Lexapro, Wellbutrin made him dazed and disoriented, Clonodine, Perphenazine  Allergies: Patient has no known allergies.  Current Medications:  Current Outpatient Medications:    clindamycin-benzoyl peroxide (BENZACLIN) gel, Apply topically every morning., Disp: , Rfl:    doxycycline (VIBRA-TABS) 100 MG tablet, Take 100 mg by mouth 2 (two) times daily., Disp: , Rfl:    cloNIDine (CATAPRES) 0.2 MG tablet, Take 1 tablet (0.2 mg total) by mouth at bedtime., Disp: 90 tablet, Rfl: 1   [START ON 05/30/2021]  Dexmethylphenidate HCl 30 MG CP24, Take 1 capsule (30 mg total) by mouth daily after breakfast., Disp: 30 capsule, Rfl: 0   [START ON 04/30/2021] Dexmethylphenidate HCl 30 MG CP24, Take 1 capsule (30 mg total) by mouth daily after breakfast., Disp: 30 capsule, Rfl: 0   Dexmethylphenidate HCl 30 MG CP24, Take 1 capsule (30 mg total) by mouth daily after breakfast., Disp: 30 capsule, Rfl: 0   escitalopram (LEXAPRO) 20 MG tablet, Take 1 tablet (20 mg total) by mouth at bedtime., Disp: 90 tablet, Rfl: 1 Medication Side Effects: none  Family Medical/ Social History: Changes?  No  MENTAL HEALTH EXAM:  There were no vitals taken for this visit.There is no height or weight on file to calculate BMI.  General Appearance: Casual and Well Groomed  Eye Contact:  Good  Speech:  Clear and Coherent and Normal Rate  Volume:  Normal  Mood:  Euthymic  Affect:  Congruent  Thought Process:  Goal Directed and Descriptions of Associations: Circumstantial  Orientation:  Full (Time, Place, and Person)  Thought Content: Logical   Suicidal Thoughts:  No  Homicidal Thoughts:  No  Memory:  WNL  Judgement:  Good  Insight:  Good  Psychomotor Activity:  Normal  Concentration:  Concentration: Good and Attention Span: Good  Recall:  Good  Fund of Knowledge: Good  Language: Good  Assets:  Desire for Improvement  ADL's:  Intact  Cognition: WNL  Prognosis:  Good    DIAGNOSES:    ICD-10-CM   1. Attention deficit hyperactivity disorder, combined type  F90.2 cloNIDine (CATAPRES) 0.2 MG tablet    Dexmethylphenidate HCl 30 MG CP24    Dexmethylphenidate HCl  30 MG CP24    Dexmethylphenidate HCl 30 MG CP24    2. Developmental coordination disorder  F82 cloNIDine (CATAPRES) 0.2 MG tablet    3. Mixed obsessional thoughts and acts  F42.2 escitalopram (LEXAPRO) 20 MG tablet    4. Major depression single episode, in partial remission (HCC)  F32.4 escitalopram (LEXAPRO) 20 MG tablet    5. Social anxiety disorder  F40.10  escitalopram (LEXAPRO) 20 MG tablet       Receiving Psychotherapy: No    RECOMMENDATIONS:  PDMP was reviewed.  Last Focalin filled 02/14/2021. I provided 30 minutes of face to face time during this encounter, including time spent before and after the visit in records review, medical decision making, and charting.  Reminded him to call if he has any probs w/ meds. Not increasing or decreasing doses on his own.  Continue clonidine 0.2 mg, 1 p.o. nightly. Continue Focalin 30 mg 1 p.o. every morning. Continue Lexapro 20 mg, 1 po qhs.  Return in 3 months.  Melony Overly, PA-C

## 2021-04-15 ENCOUNTER — Encounter (HOSPITAL_COMMUNITY): Admission: RE | Payer: Self-pay | Source: Home / Self Care

## 2021-04-15 ENCOUNTER — Ambulatory Visit (HOSPITAL_COMMUNITY): Admission: RE | Admit: 2021-04-15 | Payer: BC Managed Care – PPO | Source: Home / Self Care | Admitting: Otolaryngology

## 2021-04-15 SURGERY — SEPTOPLASTY, NOSE, WITH NASAL TURBINATE REDUCTION
Anesthesia: General | Laterality: Bilateral

## 2021-07-02 ENCOUNTER — Ambulatory Visit: Payer: BC Managed Care – PPO | Admitting: Physician Assistant

## 2021-07-02 ENCOUNTER — Other Ambulatory Visit: Payer: Self-pay

## 2021-07-02 ENCOUNTER — Encounter: Payer: Self-pay | Admitting: Physician Assistant

## 2021-07-02 DIAGNOSIS — F422 Mixed obsessional thoughts and acts: Secondary | ICD-10-CM

## 2021-07-02 DIAGNOSIS — F902 Attention-deficit hyperactivity disorder, combined type: Secondary | ICD-10-CM

## 2021-07-02 DIAGNOSIS — F4010 Social phobia, unspecified: Secondary | ICD-10-CM | POA: Diagnosis not present

## 2021-07-02 MED ORDER — DEXMETHYLPHENIDATE HCL ER 30 MG PO CP24: 30.0000 mg | ORAL_CAPSULE | Freq: Every day | ORAL | 0 refills | Status: DC

## 2021-07-02 NOTE — Progress Notes (Signed)
[facility]  Patient ID: LIZZIE COKLEY,  MRN: [id number]  PCP: Benita Stabile, MD  Date of Evaluation: 07/02/2021 Time spent:20 minutes  Chief Complaint:  Chief Complaint   ADHD; Depression; Insomnia; Follow-up       HISTORY/CURRENT STATUS: HPI For routine med check.  Former patient of Dr. Beverly Milch.  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 Focalin is still working well.  States that attention is good without easy distractibility.  Able to focus on things and finish tasks to completion.   Able to enjoy things. Energy and motivation are normal for him. Sleeps well unless chooses to stay up. Not isolating. Not crying easily. Not SI/HI.  Patient denies increased energy with decreased need for sleep, no increased talkativeness, no racing thoughts, no impulsivity or risky behaviors, no increased spending, no increased libido, no grandiosity, no increased irritability or anger, no paranoia, and no hallucinations.  Does get anxious at times but it is much better than it used to be.  He does still pick at his skin, scalp mostly but still not as bad as it has been in the past.  Denies dizziness, syncope, seizures, numbness, tingling, tremor, tics, unsteady gait, slurred speech, confusion. Denies muscle or joint pain, stiffness, or dystonia.  Individual Medical History/ Review of Systems: Changes? :No   Past medications for mental health diagnoses include: Adderall, Vyvanse caused depression, Ritalin, Lexapro, Wellbutrin made him dazed and disoriented, Clonodine, Perphenazine  Allergies: Patient has no known allergies.  Current Medications:  Current Outpatient Medications:    acetaminophen (TYLENOL) 500 MG tablet, Take 500 mg by mouth every 6 (six) hours as needed (for pain.)., Disp: , Rfl:    albuterol (VENTOLIN HFA) 108 (90 Base) MCG/ACT inhaler, Inhale 1-2 puffs into the lungs every 6 (six) hours as needed for wheezing or shortness of breath., Disp: , Rfl:     clindamycin-benzoyl peroxide (BENZACLIN) gel, Apply 1 application topically every morning., Disp: , Rfl:    cloNIDine (CATAPRES) 0.2 MG tablet, Take 1 tablet (0.2 mg total) by mouth at bedtime., Disp: 90 tablet, Rfl: 1   doxycycline (VIBRA-TABS) 100 MG tablet, Take 100 mg by mouth 2 (two) times daily., Disp: , Rfl:    escitalopram (LEXAPRO) 20 MG tablet, Take 1 tablet (20 mg total) by mouth at bedtime. (Patient taking differently: Take 20 mg by mouth in the morning.), Disp: 90 tablet, Rfl: 1   fluticasone (FLONASE) 50 MCG/ACT nasal spray, Place 1 spray into both nostrils daily as needed for allergies., Disp: , Rfl:    ibuprofen (ADVIL) 200 MG tablet, Take 400 mg by mouth every 8 (eight) hours as needed (for pain.)., Disp: , Rfl:    calcium carbonate (TUMS - DOSED IN MG ELEMENTAL CALCIUM) 500 MG chewable tablet, Chew 2 tablets by mouth daily as needed for indigestion or heartburn. (Patient not taking: Reported on 07/02/2021), Disp: , Rfl:    [START ON 08/28/2021] Dexmethylphenidate HCl 30 MG CP24, Take 1 capsule (30 mg total) by mouth daily after breakfast., Disp: 30 capsule, Rfl: 0   [START ON 07/31/2021] Dexmethylphenidate HCl 30 MG CP24, Take 1 capsule (30 mg total) by mouth daily after breakfast., Disp: 30 capsule, Rfl: 0   Dexmethylphenidate HCl 30 MG CP24, Take 1 capsule (30 mg total) by mouth daily after breakfast., Disp: 30 capsule, Rfl: 0 Medication Side Effects: none  Family Medical/ Social History: Changes?  No  MENTAL HEALTH EXAM:  There were no vitals taken for this visit.There is no  height or weight on file to calculate BMI.  General Appearance: Casual, Well Groomed, and Obese  Eye Contact:  Good  Speech:  Clear and Coherent and Normal Rate  Volume:  Normal  Mood:  Euthymic  Affect:  Congruent  Thought Process:  Goal Directed and Descriptions of Associations: Circumstantial  Orientation:  Full (Time, Place, and Person)  Thought Content: Logical   Suicidal Thoughts:  No   Homicidal Thoughts:  No  Memory:  WNL  Judgement:  Good  Insight:  Good  Psychomotor Activity:  Normal  Concentration:  Concentration: Good and Attention Span: Good  Recall:  Good  Fund of Knowledge: Good  Language: Good  Assets:  Desire for Improvement  ADL's:  Intact  Cognition: WNL  Prognosis:  Good    DIAGNOSES:    ICD-10-CM   1. Mixed obsessional thoughts and acts  F42.2     2. Attention deficit hyperactivity disorder, combined type  F90.2 Dexmethylphenidate HCl 30 MG CP24    Dexmethylphenidate HCl 30 MG CP24    Dexmethylphenidate HCl 30 MG CP24    3. Social anxiety disorder  F40.10         Receiving Psychotherapy: No    RECOMMENDATIONS:  PDMP was reviewed.  Last Focalin filled 04/01/2021. I provided 20 minutes of face to face time during this encounter, including time spent before and after the visit in records review, medical decision making, and charting.  He is doing well so no changes will be made. Continue clonidine 0.2 mg, 1 p.o. nightly. Continue Focalin 30 mg 1 p.o. every morning. Continue Lexapro 20 mg, 1 po qhs.  Return in 3 months.  Melony Overly, PA-C

## 2021-10-07 ENCOUNTER — Other Ambulatory Visit: Payer: Self-pay

## 2021-10-07 ENCOUNTER — Ambulatory Visit (INDEPENDENT_AMBULATORY_CARE_PROVIDER_SITE_OTHER): Payer: BC Managed Care – PPO | Admitting: Physician Assistant

## 2021-10-07 ENCOUNTER — Encounter: Payer: Self-pay | Admitting: Physician Assistant

## 2021-10-07 DIAGNOSIS — F902 Attention-deficit hyperactivity disorder, combined type: Secondary | ICD-10-CM

## 2021-10-07 DIAGNOSIS — F422 Mixed obsessional thoughts and acts: Secondary | ICD-10-CM | POA: Diagnosis not present

## 2021-10-07 DIAGNOSIS — F324 Major depressive disorder, single episode, in partial remission: Secondary | ICD-10-CM | POA: Diagnosis not present

## 2021-10-07 DIAGNOSIS — F4010 Social phobia, unspecified: Secondary | ICD-10-CM

## 2021-10-07 MED ORDER — DEXMETHYLPHENIDATE HCL ER 30 MG PO CP24: 30.0000 mg | ORAL_CAPSULE | Freq: Every day | ORAL | 0 refills | Status: DC

## 2021-10-07 MED ORDER — ESCITALOPRAM OXALATE 20 MG PO TABS: 20.0000 mg | ORAL_TABLET | Freq: Every day | ORAL | 1 refills | Status: DC

## 2021-10-07 NOTE — Progress Notes (Signed)
[facility]  Patient ID: Richard Kelley,  MRN: [id number]  PCP: Celene Squibb, MD  Date of Evaluation: 10/07/2021 Time spent:20 minutes  Chief Complaint:  Chief Complaint   Anxiety; ADHD; Follow-up     HISTORY/CURRENT STATUS: HPI For routine med check.    For the most part, he's doing well. Anxiety is still a problem but much better than it used to be. Not having PA, does pick at his scalp some but not bad enough to complain. Not obsessing about things.  Able to enjoy things. Energy and motivation are normal for him. Sleeps well unless chooses to stay up. Not isolating. Not crying easily. Not SI/HI.  He stopped Clonidine, didn't feel like he needed it. States that attention is good without easy distractibility.  Able to focus on things and finish tasks to completion.   Patient denies increased energy with decreased need for sleep, no increased talkativeness, no racing thoughts, no impulsivity or risky behaviors, no increased spending, no increased libido, no grandiosity, no increased irritability or anger, no paranoia, and no hallucinations.  Denies dizziness, syncope, seizures, numbness, tingling, tremor, tics, unsteady gait, slurred speech, confusion. Denies muscle or joint pain, stiffness, or dystonia.  Individual Medical History/ Review of Systems: Changes? :No   Past medications for mental health diagnoses include: Adderall, Vyvanse caused depression, Ritalin, Lexapro, Wellbutrin made him dazed and disoriented, Clonodine, Perphenazine  Allergies: Patient has no known allergies.  Current Medications:  Current Outpatient Medications:    acetaminophen (TYLENOL) 500 MG tablet, Take 500 mg by mouth every 6 (six) hours as needed (for pain.)., Disp: , Rfl:    albuterol (VENTOLIN HFA) 108 (90 Base) MCG/ACT inhaler, Inhale 1-2 puffs into the lungs every 6 (six) hours as needed for wheezing or shortness of breath., Disp: , Rfl:    calcium carbonate (TUMS - DOSED IN MG  ELEMENTAL CALCIUM) 500 MG chewable tablet, Chew 2 tablets by mouth daily as needed for indigestion or heartburn., Disp: , Rfl:    clindamycin-benzoyl peroxide (BENZACLIN) gel, Apply 1 application topically every morning., Disp: , Rfl:    [START ON 10/31/2021] Dexmethylphenidate HCl 30 MG CP24, Take 1 capsule (30 mg total) by mouth daily after breakfast., Disp: 30 capsule, Rfl: 0   [START ON 11/30/2021] Dexmethylphenidate HCl 30 MG CP24, Take 1 capsule (30 mg total) by mouth daily after breakfast., Disp: 30 capsule, Rfl: 0   [START ON 12/29/2021] Dexmethylphenidate HCl 30 MG CP24, Take 1 capsule (30 mg total) by mouth daily after breakfast., Disp: 30 capsule, Rfl: 0   doxycycline (VIBRA-TABS) 100 MG tablet, Take 100 mg by mouth 2 (two) times daily., Disp: , Rfl:    fluticasone (FLONASE) 50 MCG/ACT nasal spray, Place 1 spray into both nostrils daily as needed for allergies., Disp: , Rfl:    ibuprofen (ADVIL) 200 MG tablet, Take 400 mg by mouth every 8 (eight) hours as needed (for pain.)., Disp: , Rfl:    escitalopram (LEXAPRO) 20 MG tablet, Take 1 tablet (20 mg total) by mouth at bedtime., Disp: 90 tablet, Rfl: 1 Medication Side Effects: none  Family Medical/ Social History: Changes?  New [place] Shepherd puppy  MENTAL HEALTH EXAM:  There were no vitals taken for this visit.There is no height or weight on file to calculate BMI.  General Appearance: Casual, Well Groomed, and Obese  Eye Contact:  Good  Speech:  Clear and Coherent and Normal Rate  Volume:  Normal  Mood:  Euthymic  Affect:  Congruent  Thought Process:  Goal Directed and Descriptions of Associations: Circumstantial  Orientation:  Full (Time, Place, and Person)  Thought Content: Logical   Suicidal Thoughts:  No  Homicidal Thoughts:  No  Memory:  WNL  Judgement:  Good  Insight:  Good  Psychomotor Activity:  Normal  Concentration:  Concentration: Fair and Attention Span: Good  Recall:  Good  Fund of Knowledge: Good  Language: Good   Assets:  Desire for Improvement  ADL's:  Intact  Cognition: WNL  Prognosis:  Good    DIAGNOSES:    ICD-10-CM   1. Mixed obsessional thoughts and acts  F42.2 escitalopram (LEXAPRO) 20 MG tablet    2. Attention deficit hyperactivity disorder, combined type  F90.2     3. Social anxiety disorder  F40.10 escitalopram (LEXAPRO) 20 MG tablet    4. Major depression single episode, in partial remission (HCC)  F32.4 escitalopram (LEXAPRO) 20 MG tablet        Receiving Psychotherapy: No    RECOMMENDATIONS:  PDMP was reviewed.  Last Focalin filled 10/03/2021. I provided 20 minutes of face to face time during this encounter, including time spent before and after the visit in records review, medical decision making, counseling pertinent to today's visit, and charting.  He's doing well so no changes are needed.  Discontinued clonidine, per his choice, felt like he didn't need it.  Continue Focalin 30 mg 1 p.o. every morning. Continue Lexapro 20 mg, 1 po qhs.  Return in 6 months.  Donnal Moat, PA-C

## 2021-12-24 ENCOUNTER — Other Ambulatory Visit (HOSPITAL_COMMUNITY): Payer: Self-pay | Admitting: Family Medicine

## 2021-12-24 ENCOUNTER — Other Ambulatory Visit: Payer: Self-pay | Admitting: Family Medicine

## 2021-12-24 DIAGNOSIS — R220 Localized swelling, mass and lump, head: Secondary | ICD-10-CM

## 2022-01-01 IMAGING — CT CT HEAD W/O CM
3 series · 15 of 47 positions shown, 18 images · non-contrast
Comparison: No pertinent prior studies available for comparison.

CLINICAL DATA: Other complicated headache syndrome. Additional
provided: Chronic frontal headache with intense pressure prior to
headache, patient states pain radiates down through maxillary
sinuses bilaterally.

EXAM:
CT HEAD WITHOUT CONTRAST
TECHNIQUE: Contiguous axial images were obtained from the base of the skull
through the vertex without intravenous contrast.

[Series 2: head w o · axial · 0.45mm/px · z∈[+368,+498]mm · 9 of 32 slices shown, 12 images]
[im 3/32  brain]
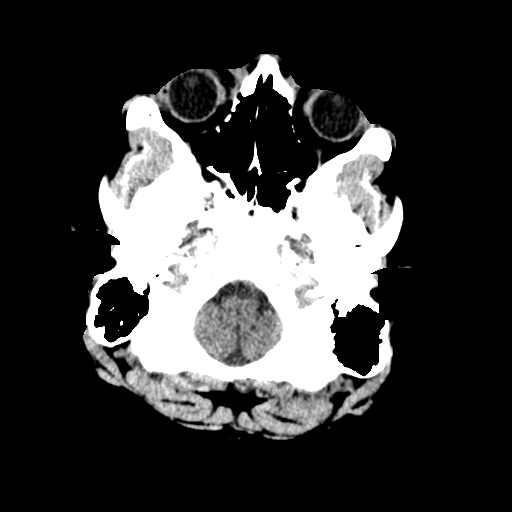
[im 3/32  bone]
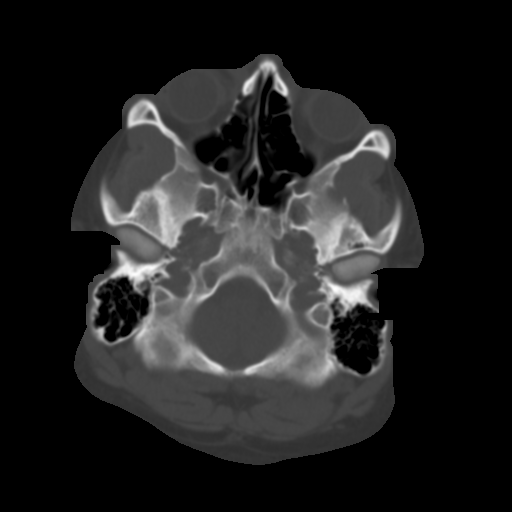
[im 6/32  brain]
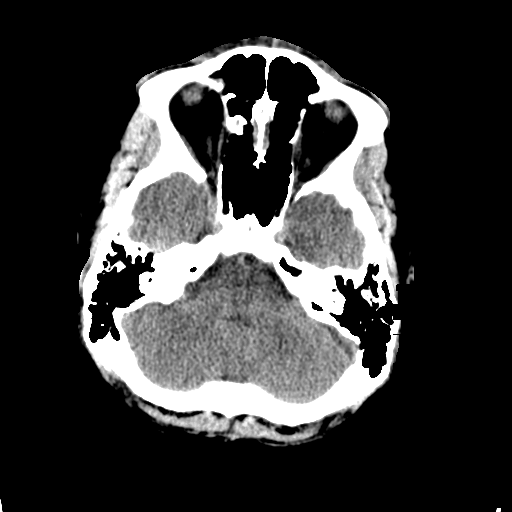
[im 9/32  brain]
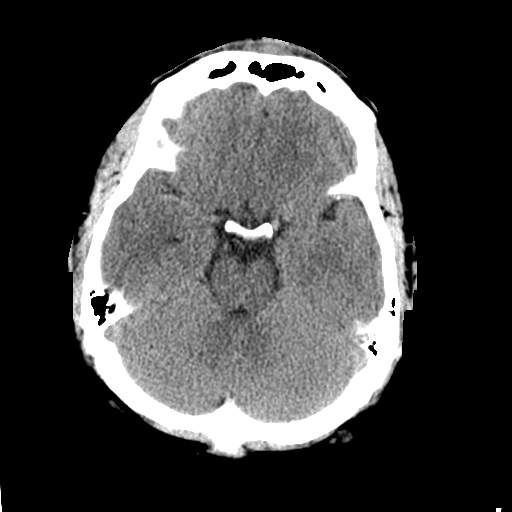
[im 12/32  brain]
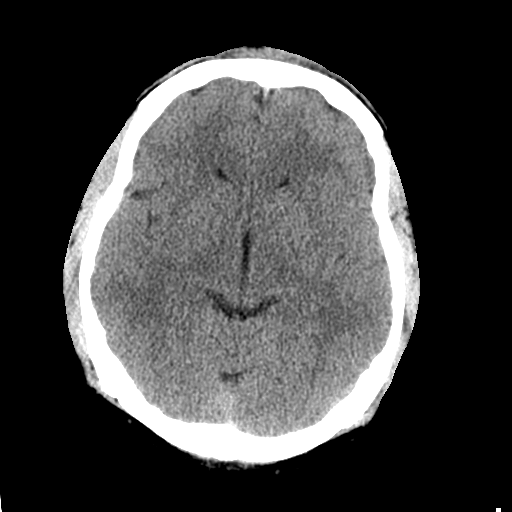
[im 17/32  brain]
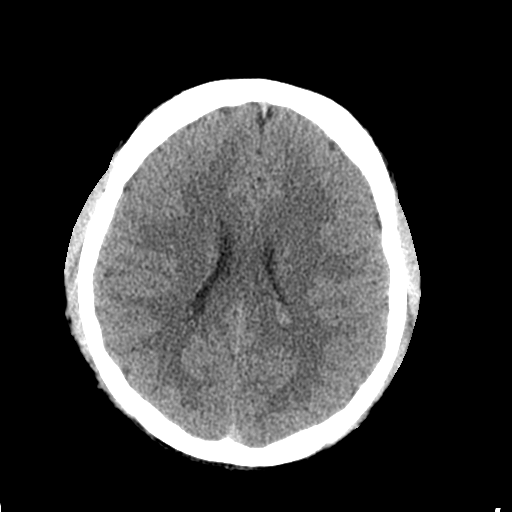
[im 17/32  bone]
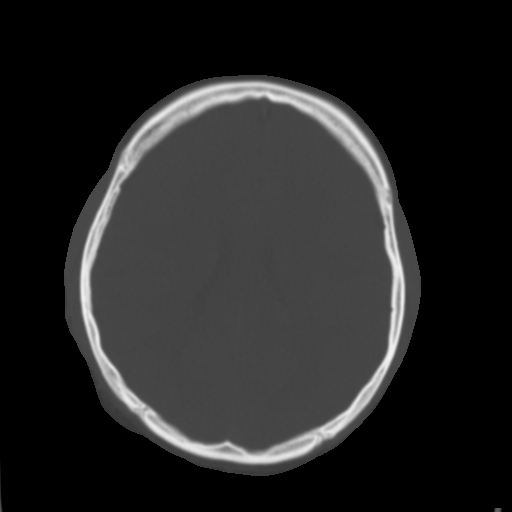
[im 20/32  brain]
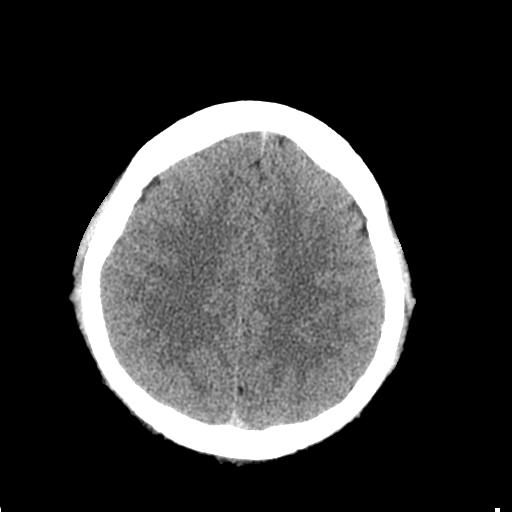
[im 23/32  brain]
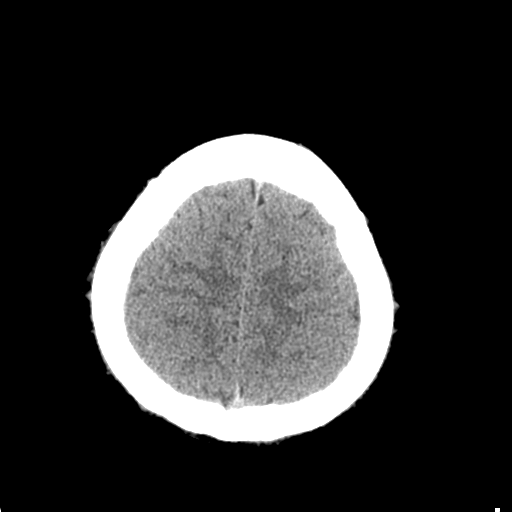
[im 26/32  brain]
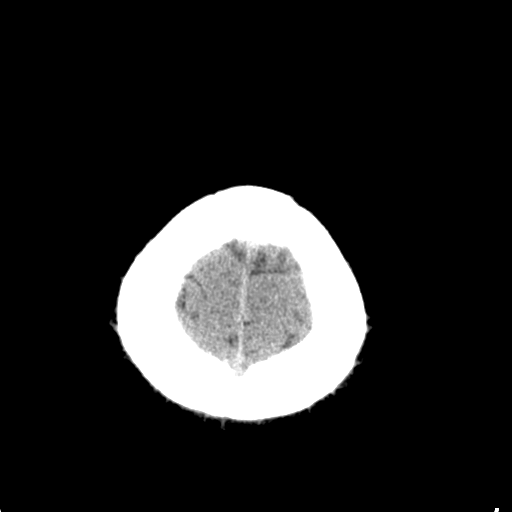
[im 29/32  brain]
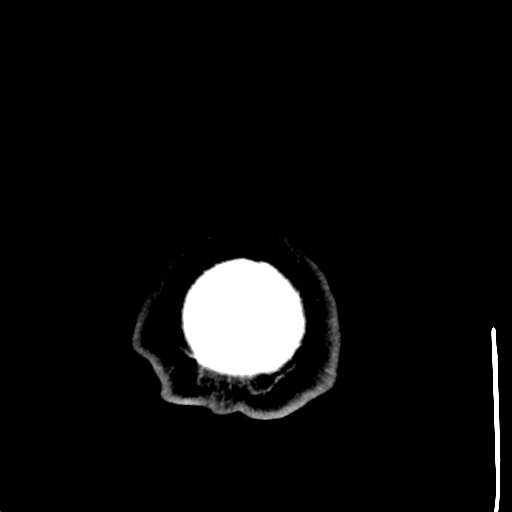
[im 29/32  bone]
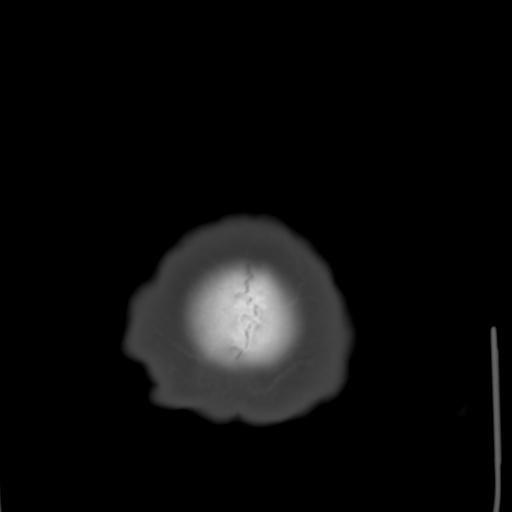

[Series 4: coronal soft · coronal · 0.38mm/px · 3 of 84 slices shown]
[im 28/84  brain]
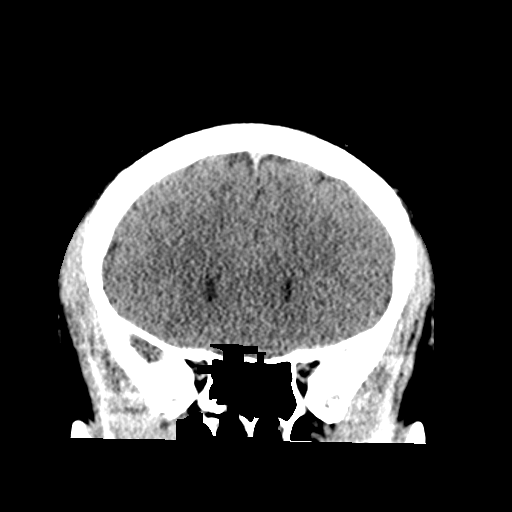
[im 37/84  brain]
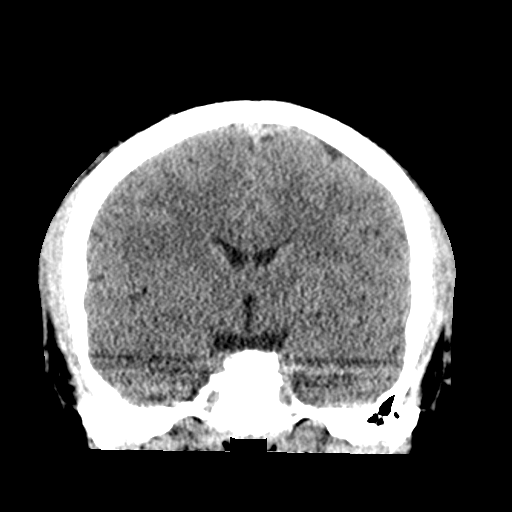
[im 47/84  brain]
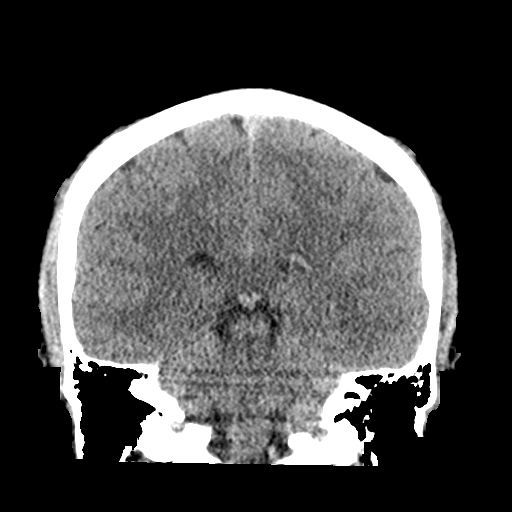

[Series 5: sagittal soft · sagittal · 0.36mm/px · 3 of 68 slices shown]
[im 23/68  brain]
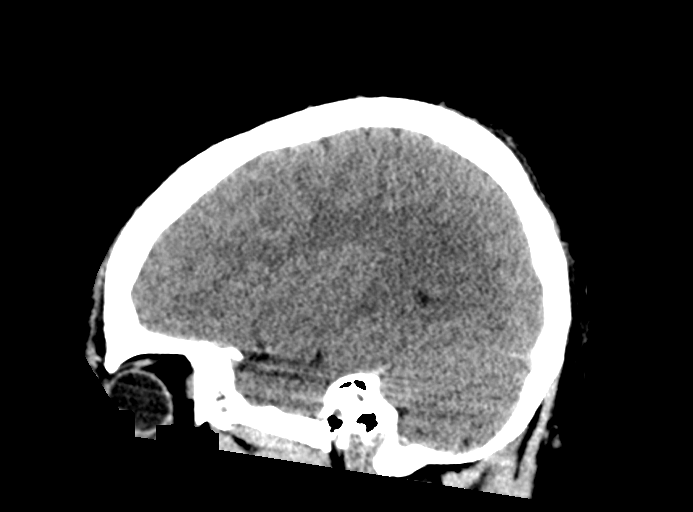
[im 34/68  brain]
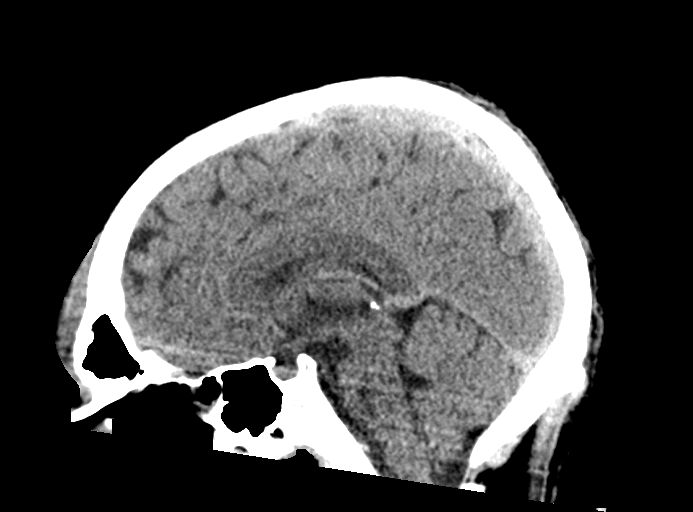
[im 45/68  brain]
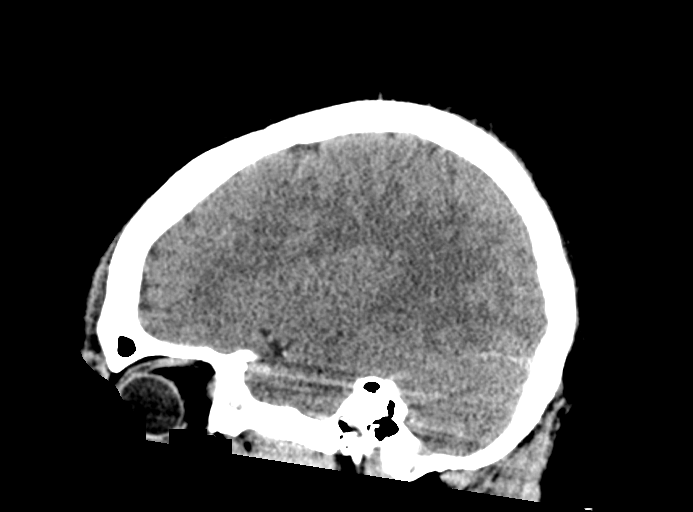

[15 of 47 positions shown; findings below may reference images not displayed]

FINDINGS: Brain:

Cerebral volume is normal.

There is no acute intracranial hemorrhage.

No demarcated cortical infarct.

No extra-axial fluid collection.

No evidence of intracranial mass.

No midline shift.

Vascular: No hyperdense vessel.

Skull: No calvarial fracture or suspicious osseous lesion.

Sinuses/Orbits: Visualized orbits show no acute finding. Subtle,
presumed chronic deformity of the right lamina papyracea (series 3,
image 13). No significant paranasal sinus disease or mastoid
effusion at the imaged levels.
IMPRESSION: 1. Unremarkable non-contrast CT appearance of the brain. No evidence
of acute intracranial abnormality.
2. Subtle, presumed chronic deformity of the right lamina papyracea.

## 2022-01-13 ENCOUNTER — Ambulatory Visit (HOSPITAL_COMMUNITY)
Admission: RE | Admit: 2022-01-13 | Discharge: 2022-01-13 | Disposition: A | Discharge status: Home / Self Care | Payer: Commercial Managed Care - PPO | Source: Ambulatory Visit | Attending: Family Medicine | Admitting: Family Medicine

## 2022-01-13 DIAGNOSIS — R220 Localized swelling, mass and lump, head: Secondary | ICD-10-CM | POA: Diagnosis present

## 2022-04-07 ENCOUNTER — Ambulatory Visit (INDEPENDENT_AMBULATORY_CARE_PROVIDER_SITE_OTHER): Payer: BC Managed Care – PPO | Admitting: Physician Assistant

## 2022-04-07 ENCOUNTER — Encounter: Payer: Self-pay | Admitting: Physician Assistant

## 2022-04-07 DIAGNOSIS — F324 Major depressive disorder, single episode, in partial remission: Secondary | ICD-10-CM

## 2022-04-07 DIAGNOSIS — F4010 Social phobia, unspecified: Secondary | ICD-10-CM

## 2022-04-07 DIAGNOSIS — F902 Attention-deficit hyperactivity disorder, combined type: Secondary | ICD-10-CM

## 2022-04-07 DIAGNOSIS — F422 Mixed obsessional thoughts and acts: Secondary | ICD-10-CM

## 2022-04-07 MED ORDER — ESCITALOPRAM OXALATE 20 MG PO TABS: 20.0000 mg | ORAL_TABLET | Freq: Every day | ORAL | 1 refills | Status: DC

## 2022-04-07 NOTE — Progress Notes (Signed)
[facility]  Patient ID: Richard Kelley,  MRN: [id number]  PCP: Benita Stabile, MD  Date of Evaluation: 04/07/2022 Time spent:20 minutes  Chief Complaint:  Chief Complaint   Anxiety; ADD; Follow-up    HISTORY/CURRENT STATUS: HPI For routine med check.    Doing well. Brother got married this past weekend. 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 Is 'self-studying.'  States that attention is good without easy distractibility.  Able to focus on things and finish tasks to completion.  He does not take the Focalin every day, it is effective when he takes it.  Patient is able to enjoy things.  Energy and motivation are good.  No extreme sadness, tearfulness, or feelings of hopelessness.  Sleeps well most of the time. ADLs and personal hygiene are normal.   Appetite has not changed.  Weight is stable. Denies suicidal or homicidal thoughts.  He is not obsessing about things like he did.  Occasionally he picks at his skin but it is not common.  "It is a lot less than it used to be."  Patient denies increased energy with decreased need for sleep, increased talkativeness, racing thoughts, impulsivity or risky behaviors, increased spending, increased libido, grandiosity, increased irritability or anger, paranoia, and no hallucinations.  Denies dizziness, syncope, seizures, numbness, tingling, tremor, tics, unsteady gait, slurred speech, confusion. Denies muscle or joint pain, stiffness, or dystonia.  Individual Medical History/ Review of Systems: Changes? :No   Past medications for mental health diagnoses include: Adderall, Vyvanse caused depression, Ritalin, Lexapro, Wellbutrin made him dazed and disoriented, Clonodine, Perphenazine  Allergies: Patient has no known allergies.  Current Medications:  Current Outpatient Medications:    acetaminophen (TYLENOL) 500 MG tablet, Take 500 mg by mouth every 6 (six) hours as needed (for pain.)., Disp: , Rfl:    albuterol (VENTOLIN  HFA) 108 (90 Base) MCG/ACT inhaler, Inhale 1-2 puffs into the lungs every 6 (six) hours as needed for wheezing or shortness of breath., Disp: , Rfl:    calcium carbonate (TUMS - DOSED IN MG ELEMENTAL CALCIUM) 500 MG chewable tablet, Chew 2 tablets by mouth daily as needed for indigestion or heartburn., Disp: , Rfl:    clindamycin-benzoyl peroxide (BENZACLIN) gel, Apply 1 application topically every morning., Disp: , Rfl:    Dexmethylphenidate HCl 30 MG CP24, Take 1 capsule (30 mg total) by mouth daily after breakfast., Disp: 30 capsule, Rfl: 0   Dexmethylphenidate HCl 30 MG CP24, Take 1 capsule (30 mg total) by mouth daily after breakfast., Disp: 30 capsule, Rfl: 0   Dexmethylphenidate HCl 30 MG CP24, Take 1 capsule (30 mg total) by mouth daily after breakfast., Disp: 30 capsule, Rfl: 0   fluticasone (FLONASE) 50 MCG/ACT nasal spray, Place 1 spray into both nostrils daily as needed for allergies., Disp: , Rfl:    ibuprofen (ADVIL) 200 MG tablet, Take 400 mg by mouth every 8 (eight) hours as needed (for pain.)., Disp: , Rfl:    doxycycline (VIBRA-TABS) 100 MG tablet, Take 100 mg by mouth 2 (two) times daily. (Patient not taking: Reported on 04/07/2022), Disp: , Rfl:    escitalopram (LEXAPRO) 20 MG tablet, Take 1 tablet (20 mg total) by mouth at bedtime., Disp: 90 tablet, Rfl: 1 Medication Side Effects: none  Family Medical/ Social History: Changes?  No  MENTAL HEALTH EXAM:  There were no vitals taken for this visit.There is no height or weight on file to calculate BMI.  General Appearance: Casual, Well Groomed, and Obese  Eye Contact:  Good  Speech:  Clear and Coherent and Normal Rate  Volume:  Normal  Mood:  Euthymic  Affect:  Congruent  Thought Process:  Goal Directed and Descriptions of Associations: Circumstantial  Orientation:  Full (Time, Place, and Person)  Thought Content: Logical   Suicidal Thoughts:  No  Homicidal Thoughts:  No  Memory:  WNL  Judgement:  Good  Insight:  Good   Psychomotor Activity:  Normal  Concentration:  Concentration: Good and Attention Span: Good  Recall:  Good  Fund of Knowledge: Good  Language: Good  Assets:  Desire for Improvement  ADL's:  Intact  Cognition: WNL  Prognosis:  Good    DIAGNOSES:    ICD-10-CM   1. Mixed obsessional thoughts and acts  F42.2 escitalopram (LEXAPRO) 20 MG tablet    2. Attention deficit hyperactivity disorder, combined type  F90.2     3. Social anxiety disorder  F40.10 escitalopram (LEXAPRO) 20 MG tablet    4. Major depression single episode, in partial remission (HCC)  F32.4 escitalopram (LEXAPRO) 20 MG tablet     Receiving Psychotherapy: No   RECOMMENDATIONS:  PDMP was reviewed.  Last Focalin filled 10/03/2021. I provided 20 minutes of face to face time during this encounter, including time spent before and after the visit in records review, medical decision making, counseling pertinent to today's visit, and charting.   He is doing well so no changes in meds need to be made. Continue Focalin 30 mg 1 p.o. every morning. Continue Lexapro 20 mg, 1 po qhs.  Return in 6 months.  Melony Overly, PA-C

## 2022-10-08 ENCOUNTER — Ambulatory Visit (INDEPENDENT_AMBULATORY_CARE_PROVIDER_SITE_OTHER): Payer: Self-pay | Admitting: Physician Assistant

## 2022-10-08 ENCOUNTER — Encounter: Payer: Self-pay | Admitting: Physician Assistant

## 2022-10-08 DIAGNOSIS — F4010 Social phobia, unspecified: Secondary | ICD-10-CM | POA: Diagnosis not present

## 2022-10-08 DIAGNOSIS — F422 Mixed obsessional thoughts and acts: Secondary | ICD-10-CM | POA: Diagnosis not present

## 2022-10-08 DIAGNOSIS — F324 Major depressive disorder, single episode, in partial remission: Secondary | ICD-10-CM

## 2022-10-08 MED ORDER — ESCITALOPRAM OXALATE 20 MG PO TABS: 20.0000 mg | ORAL_TABLET | Freq: Every day | ORAL | 1 refills | Status: DC

## 2022-10-08 MED ORDER — DEXMETHYLPHENIDATE HCL ER 20 MG PO CP24: 20.0000 mg | ORAL_CAPSULE | Freq: Every day | ORAL | 0 refills | Status: DC

## 2022-10-08 NOTE — Progress Notes (Signed)
[facility]  Patient ID: Richard Kelley,  MRN: [id number]  PCP: Celene Squibb, MD  Date of Evaluation: 10/08/2022 Time spent:20 minutes  Chief Complaint:  Chief Complaint   ADD; Depression; Follow-up    HISTORY/CURRENT STATUS: HPI For routine med check.    States he's fine. Patient is able to enjoy things.  Energy and motivation are good.  He likes to play computer games with a friend. Also helps take care of the families 5 dogs. 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   No extreme sadness, tearfulness, or feelings of hopelessness.  Sleeps well most of the time. ADLs and personal hygiene are normal.  Appetite has not changed.  Weight is stable.   Denies suicidal or homicidal thoughts.  Wonders if he should decrease the Focalin dose. He doesn't take it often, and it is helpful, but causes anxiety sometimes. Doesn't get anxious otherwise, the Lexapro has helped that.   Patient denies increased energy with decreased need for sleep, increased talkativeness, racing thoughts, impulsivity or risky behaviors, increased spending, increased libido, grandiosity, increased irritability or anger, paranoia, or hallucinations.  Denies dizziness, syncope, seizures, numbness, tingling, tremor, tics, unsteady gait, slurred speech, confusion. Denies muscle or joint pain, stiffness, or dystonia.  Individual Medical History/ Review of Systems: Changes? :No   Past medications for mental health diagnoses include: Adderall, Vyvanse caused depression, Ritalin, Lexapro, Wellbutrin made him dazed and disoriented, Clonodine, Perphenazine  Allergies: Patient has no known allergies.  Current Medications:  Current Outpatient Medications:    acetaminophen (TYLENOL) 500 MG tablet, Take 500 mg by mouth every 6 (six) hours as needed (for pain.)., Disp: , Rfl:    albuterol (VENTOLIN HFA) 108 (90 Base) MCG/ACT inhaler, Inhale 1-2 puffs into the lungs every 6 (six) hours as needed for wheezing or shortness of  breath., Disp: , Rfl:    calcium carbonate (TUMS - DOSED IN MG ELEMENTAL CALCIUM) 500 MG chewable tablet, Chew 2 tablets by mouth daily as needed for indigestion or heartburn., Disp: , Rfl:    clindamycin-benzoyl peroxide (BENZACLIN) gel, Apply 1 application topically every morning., Disp: , Rfl:    dexmethylphenidate (FOCALIN XR) 20 MG 24 hr capsule, Take 1 capsule (20 mg total) by mouth daily., Disp: 30 capsule, Rfl: 0   fluticasone (FLONASE) 50 MCG/ACT nasal spray, Place 1 spray into both nostrils daily as needed for allergies., Disp: , Rfl:    ibuprofen (ADVIL) 200 MG tablet, Take 400 mg by mouth every 8 (eight) hours as needed (for pain.)., Disp: , Rfl:    doxycycline (VIBRA-TABS) 100 MG tablet, Take 100 mg by mouth 2 (two) times daily. (Patient not taking: Reported on 04/07/2022), Disp: , Rfl:    escitalopram (LEXAPRO) 20 MG tablet, Take 1 tablet (20 mg total) by mouth at bedtime., Disp: 90 tablet, Rfl: 1 Medication Side Effects: none  Family Medical/ Social History: Changes?  Great grandmother died since LOV, one of his dogs died.   MENTAL HEALTH EXAM:  There were no vitals taken for this visit.There is no height or weight on file to calculate BMI.  General Appearance: Casual, Well Groomed, and Obese  Eye Contact:  Good  Speech:  Clear and Coherent and Normal Rate  Volume:  Normal  Mood:  Euthymic  Affect:  Congruent  Thought Process:  Goal Directed and Descriptions of Associations: Circumstantial  Orientation:  Full (Time, Place, and Person)  Thought Content: Logical   Suicidal Thoughts:  No  Homicidal Thoughts:  No  Memory:  WNL  Judgement:  Good  Insight:  Good  Psychomotor Activity:  Normal  Concentration:  Concentration: Good and Attention Span: Good  Recall:  Good  Fund of Knowledge: Good  Language: Good  Assets:  Desire for Improvement Financial Resources/Insurance Housing Transportation Vocational/Educational  ADL's:  Intact  Cognition: WNL  Prognosis:  Good    DIAGNOSES:    ICD-10-CM   1. Major depression single episode, in partial remission (HCC)  F32.4 escitalopram (LEXAPRO) 20 MG tablet    2. Social anxiety disorder  F40.10 escitalopram (LEXAPRO) 20 MG tablet    3. Mixed obsessional thoughts and acts  F42.2 escitalopram (LEXAPRO) 20 MG tablet      Receiving Psychotherapy: No   RECOMMENDATIONS:  PDMP was reviewed.  Last Focalin filled 10/03/2021. (Pt states he's been getting it but no record of it since this date.)  I provided 20 minutes of face to face time during this encounter, including time spent before and after the visit in records review, medical decision making, counseling pertinent to today's visit, and charting.   He does take the Focalin as needed but states he got some recently.  We discussed the dosage and his side effect of anxiety.  Recommend decreasing the dose.  He can let me know if this dose works and I can send.  Prescriptions to get him to his next visit.  Decrease Focalin XR to 20 mg, 1 p.o. every morning as needed. Continue Lexapro 20 mg, 1 po qhs.  Return in 6 months.  Donnal Moat, PA-C

## 2023-12-29 ENCOUNTER — Encounter: Payer: Self-pay | Admitting: *Deleted

## 2023-12-29 ENCOUNTER — Encounter (INDEPENDENT_AMBULATORY_CARE_PROVIDER_SITE_OTHER): Payer: Self-pay | Admitting: Otolaryngology

## 2024-01-01 NOTE — Progress Notes (Unsigned)
 Referring Provider: Omie Bickers, MD Primary Care Physician:  Omie Bickers, MD Primary Gastroenterologist:  Dr. Mordechai April  Chief Complaint  Patient presents with   Gastroesophageal Reflux    Acid reflux at night really bad. Has it right after he eats also. Food is getting stuck in throat.     HPI:   Richard Kelley is a 23 y.o. male presenting today at the request of Omie Bickers, MD for GERD.    Reviewed referral information: Office visit 12/15/2023.  Multiple chronic conditions addressed including generalized anxiety disorder, ADHD, mixed hyperlipidemia, insomnia for which chronic medications were continued.  Noted deviated nasal septum with plans for surgery, but patient requested referral to cardiology due to a congenital heart defect and surgery requiring him to be seen by cardiology first.  Also reporting chronic GERD and requested referral to GI.  Also noted mildly elevated alk phos of 138, other LFTs normal.  Queried whether this was related to growth.  Planned to monitor and work up further at next visit if still elevated.   Today: Presents today with his father.   Infrequent for the last few years. Few months of reflux. This is occurring at least 3 times a week. 3-4 times in the last couple of weeks, has woken up with reflux in his mouth. The last few months, has been feeling bloated and some fullness even before eating, but still eating normally. No unintentional weight loss. Occasional nausea, about once a week. No vomiting.   Takes tums as needed. Helps. Doesn't taken anything a daily basis.   Working on diet for the last month. Decreased soda. 1 can of soda in the last week. No coffee. No regular fried foods. Rare spicy foods.   Intermittent dysphagia 3-4 times a week or 1-2 times a month. Not a larger bite or when eating faster. Items will get stuck half way. Sometimes items will pass, other times will regurgitate items. Started about 8 months ago.   No brbpr, melena.  No  bowel concerns.    Has sleep apnea. Has appointment this afternoon to discuss CPAP.   Ibuprofen rarely  Does not have congenital heart defect.  Per last cardiology note in 2021, mom reported there was some concern about "hole in heart", but this was not confirmed by ultrasound.  Echocardiogram was updated in 2021 and was normal.   Grandfather has cirrhosis. Has alpha 1 anti trypsin deficiency. Plans to be tested.  Has labs with PCP October 16th.    Past Medical History:  Diagnosis Date   ADHD    Anxiety    Hypertension     Past Surgical History:  Procedure Laterality Date   No prior surgery      Current Outpatient Medications  Medication Sig Dispense Refill   acetaminophen (TYLENOL) 500 MG tablet Take 500 mg by mouth every 6 (six) hours as needed (for pain.).     albuterol (VENTOLIN HFA) 108 (90 Base) MCG/ACT inhaler Inhale 1-2 puffs into the lungs every 6 (six) hours as needed for wheezing or shortness of breath.     calcium carbonate (TUMS - DOSED IN MG ELEMENTAL CALCIUM) 500 MG chewable tablet Chew 2 tablets by mouth daily as needed for indigestion or heartburn.     cetirizine (ZYRTEC) 10 MG chewable tablet Chew 10 mg by mouth daily.     cloNIDine  (CATAPRES ) 0.2 MG tablet Take 1 tablet twice a day by oral route for 90 days.     dexmethylphenidate  (FOCALIN   XR) 20 MG 24 hr capsule Take 1 capsule (20 mg total) by mouth daily. 30 capsule 0   escitalopram  (LEXAPRO ) 20 MG tablet Take 1 tablet (20 mg total) by mouth at bedtime. 90 tablet 1   ibuprofen (ADVIL) 200 MG tablet Take 400 mg by mouth every 8 (eight) hours as needed (for pain.).     pantoprazole (PROTONIX) 40 MG tablet Take 1 tablet (40 mg total) by mouth daily before breakfast. 30 tablet 3   No current facility-administered medications for this visit.    Allergies as of 01/04/2024   (No Known Allergies)    Family History  Problem Relation Age of Onset   Irregular heart beat Mother    Irregular heart beat Father     Hypertension Maternal Grandmother    Alpha-1 antitrypsin deficiency Paternal Grandfather    Cirrhosis Paternal Grandfather     Social History   Socioeconomic History   Marital status: Single    Spouse name: Not on file   Number of children: Not on file   Years of education: Not on file   Highest education level: Not on file  Occupational History   Not on file  Tobacco Use   Smoking status: Never   Smokeless tobacco: Never  Vaping Use   Vaping status: Never Used  Substance and Sexual Activity   Alcohol use: Never   Drug use: Never   Sexual activity: Never  Other Topics Concern   Not on file  Social History Narrative   Not on file   Social Drivers of Health   Financial Resource Strain: Not on file  Food Insecurity: Not on file  Transportation Needs: Not on file  Physical Activity: Not on file  Stress: Not on file  Social Connections: Not on file  Intimate Partner Violence: Not on file    Review of Systems: Gen: Denies any fever, chills, cold or flulike symptoms, presyncope, syncope. CV: Denies chest pain, heart palpitations. Resp: Denies shortness of breath, cough. GI: See HPI GU : Denies urinary burning, urinary frequency, urinary hesitancy MS: Denies joint pain. Derm: Denies rash. Psych: Denies depression, anxiety. Heme: See HPI  Physical Exam: BP 133/89 (BP Location: Left Arm, Patient Position: Sitting, Cuff Size: Large)   Pulse 90   Temp 97.7 F (36.5 C) (Temporal)   Ht 6' (1.829 m)   Wt 281 lb 9.6 oz (127.7 kg)   BMI 38.19 kg/m  General:   Alert and oriented. Pleasant and cooperative. Well-nourished and well-developed.  Head:  Normocephalic and atraumatic. Eyes:  Without icterus, sclera clear and conjunctiva pink.  Ears:  Normal auditory acuity. Lungs:  Clear to auscultation bilaterally. No wheezes, rales, or rhonchi. No distress.  Heart:  S1, S2 present without murmurs appreciated.  Abdomen:  +BS, soft, non-tender and non-distended. No HSM  noted. No guarding or rebound. No masses appreciated.  Rectal:  Deferred  Msk:  Symmetrical without gross deformities. Normal posture. Extremities:  Without edema. Neurologic:  Alert and  oriented x4;  grossly normal neurologically. Skin:  Intact without significant lesions or rashes. Psych: Normal mood and affect.    Assessment:  23 year old male with history of anxiety, HTN, ADHD, presenting today at the request of Dr. Denman Fischer for further evaluation of GERD.  Patient also reporting dysphagia.  GERD: Few year history of infrequent reflux, but has increased in frequency over the last few months occurring at least 3-4 times a week.  Also with intermittent bloated sensation and early satiety but continues to eat  normally and has had no unintentional weight loss.  He has made dietary changes over the last month due to diagnosis of prediabetes, but continues with fairly frequent reflux symptoms and not currently taking anything on a daily basis.  I will plan to start him on daily PPI and also arrange EGD to evaluate for gastritis, duodenitis, PUD, H. pylori.  Dysphagia: Intermittent solid food dysphagia for the last few months.  Occasional regurgitation.  Symptoms could be related to esophagitis in the setting of GERD versus esophageal web, ring, stricture, or possibly EOE.  Needs EGD for further evaluation.  Elevated alkaline phosphatase: Recent labs in April with PCP showed mildly elevated alkaline phosphatase of 138, other LFTs within normal limits per OV note in referral. I do not have actual lab report to refer to.  He has no symptoms of decompensated liver disease.  No abdominal imaging on file.  No ETOH. As this is the first time alk phos has been elevated, we will plan to follow-up on this at next visit with repeat labs and additional workup if needed.  Of note, he has made significant dietary changes recently due to prediabetes and was encouraged to continue with this.  He also reports  grandfather with history of cirrhosis, alpha-1 antitrypsin deficiency, and already has plan placed to be checked for this.   Plan:  Pantoprazole 40 mg daily. Proceed with upper endoscopy +/- dilation with propofol by Dr. Mordechai April in near future. The risks, benefits, and alternatives have been discussed with the patient in detail. The patient states understanding and desires to proceed.  ASA 2 Follow a GERD diet:  Avoid fried, fatty, greasy, spicy, citrus foods. Avoid caffeine and carbonated beverages. Avoid chocolate. Try eating 4-6 small meals a day rather than 3 large meals. Do not eat within 3 hours of laying down. Prop head of bed up on wood or bricks to create a 6 inch incline. Dysphagia precautions:  Eat slowly, take small bites, chew thoroughly, drink plenty of liquids throughout meals.  Avoid trough textures All meats should be chopped finely.  If something gets hung in your esophagus and will not come up or go down, proceed to the emergency room.   Follow-up after EGD.    Shana Daring, PA-C [facility] 01/04/2024

## 2024-01-01 NOTE — H&P (View-Only) (Signed)
 Referring Provider: Omie Bickers, MD Primary Care Physician:  Omie Bickers, MD Primary Gastroenterologist:  Dr. Mordechai April  Chief Complaint  Patient presents with   Gastroesophageal Reflux    Acid reflux at night really bad. Has it right after he eats also. Food is getting stuck in throat.     HPI:   Richard Kelley is a 23 y.o. male presenting today at the request of Omie Bickers, MD for GERD.    Reviewed referral information: Office visit 12/15/2023.  Multiple chronic conditions addressed including generalized anxiety disorder, ADHD, mixed hyperlipidemia, insomnia for which chronic medications were continued.  Noted deviated nasal septum with plans for surgery, but patient requested referral to cardiology due to a congenital heart defect and surgery requiring him to be seen by cardiology first.  Also reporting chronic GERD and requested referral to GI.  Also noted mildly elevated alk phos of 138, other LFTs normal.  Queried whether this was related to growth.  Planned to monitor and work up further at next visit if still elevated.   Today: Presents today with his father.   Infrequent for the last few years. Few months of reflux. This is occurring at least 3 times a week. 3-4 times in the last couple of weeks, has woken up with reflux in his mouth. The last few months, has been feeling bloated and some fullness even before eating, but still eating normally. No unintentional weight loss. Occasional nausea, about once a week. No vomiting.   Takes tums as needed. Helps. Doesn't taken anything a daily basis.   Working on diet for the last month. Decreased soda. 1 can of soda in the last week. No coffee. No regular fried foods. Rare spicy foods.   Intermittent dysphagia 3-4 times a week or 1-2 times a month. Not a larger bite or when eating faster. Items will get stuck half way. Sometimes items will pass, other times will regurgitate items. Started about 8 months ago.   No brbpr, melena.  No  bowel concerns.    Has sleep apnea. Has appointment this afternoon to discuss CPAP.   Ibuprofen rarely  Does not have congenital heart defect.  Per last cardiology note in 2021, mom reported there was some concern about "hole in heart", but this was not confirmed by ultrasound.  Echocardiogram was updated in 2021 and was normal.   Grandfather has cirrhosis. Has alpha 1 anti trypsin deficiency. Plans to be tested.  Has labs with PCP October 16th.    Past Medical History:  Diagnosis Date   ADHD    Anxiety    Hypertension     Past Surgical History:  Procedure Laterality Date   No prior surgery      Current Outpatient Medications  Medication Sig Dispense Refill   acetaminophen (TYLENOL) 500 MG tablet Take 500 mg by mouth every 6 (six) hours as needed (for pain.).     albuterol (VENTOLIN HFA) 108 (90 Base) MCG/ACT inhaler Inhale 1-2 puffs into the lungs every 6 (six) hours as needed for wheezing or shortness of breath.     calcium carbonate (TUMS - DOSED IN MG ELEMENTAL CALCIUM) 500 MG chewable tablet Chew 2 tablets by mouth daily as needed for indigestion or heartburn.     cetirizine (ZYRTEC) 10 MG chewable tablet Chew 10 mg by mouth daily.     cloNIDine  (CATAPRES ) 0.2 MG tablet Take 1 tablet twice a day by oral route for 90 days.     dexmethylphenidate  (FOCALIN   XR) 20 MG 24 hr capsule Take 1 capsule (20 mg total) by mouth daily. 30 capsule 0   escitalopram  (LEXAPRO ) 20 MG tablet Take 1 tablet (20 mg total) by mouth at bedtime. 90 tablet 1   ibuprofen (ADVIL) 200 MG tablet Take 400 mg by mouth every 8 (eight) hours as needed (for pain.).     pantoprazole (PROTONIX) 40 MG tablet Take 1 tablet (40 mg total) by mouth daily before breakfast. 30 tablet 3   No current facility-administered medications for this visit.    Allergies as of 01/04/2024   (No Known Allergies)    Family History  Problem Relation Age of Onset   Irregular heart beat Mother    Irregular heart beat Father     Hypertension Maternal Grandmother    Alpha-1 antitrypsin deficiency Paternal Grandfather    Cirrhosis Paternal Grandfather     Social History   Socioeconomic History   Marital status: Single    Spouse name: Not on file   Number of children: Not on file   Years of education: Not on file   Highest education level: Not on file  Occupational History   Not on file  Tobacco Use   Smoking status: Never   Smokeless tobacco: Never  Vaping Use   Vaping status: Never Used  Substance and Sexual Activity   Alcohol use: Never   Drug use: Never   Sexual activity: Never  Other Topics Concern   Not on file  Social History Narrative   Not on file   Social Drivers of Health   Financial Resource Strain: Not on file  Food Insecurity: Not on file  Transportation Needs: Not on file  Physical Activity: Not on file  Stress: Not on file  Social Connections: Not on file  Intimate Partner Violence: Not on file    Review of Systems: Gen: Denies any fever, chills, cold or flulike symptoms, presyncope, syncope. CV: Denies chest pain, heart palpitations. Resp: Denies shortness of breath, cough. GI: See HPI GU : Denies urinary burning, urinary frequency, urinary hesitancy MS: Denies joint pain. Derm: Denies rash. Psych: Denies depression, anxiety. Heme: See HPI  Physical Exam: BP 133/89 (BP Location: Left Arm, Patient Position: Sitting, Cuff Size: Large)   Pulse 90   Temp 97.7 F (36.5 C) (Temporal)   Ht 6' (1.829 m)   Wt 281 lb 9.6 oz (127.7 kg)   BMI 38.19 kg/m  General:   Alert and oriented. Pleasant and cooperative. Well-nourished and well-developed.  Head:  Normocephalic and atraumatic. Eyes:  Without icterus, sclera clear and conjunctiva pink.  Ears:  Normal auditory acuity. Lungs:  Clear to auscultation bilaterally. No wheezes, rales, or rhonchi. No distress.  Heart:  S1, S2 present without murmurs appreciated.  Abdomen:  +BS, soft, non-tender and non-distended. No HSM  noted. No guarding or rebound. No masses appreciated.  Rectal:  Deferred  Msk:  Symmetrical without gross deformities. Normal posture. Extremities:  Without edema. Neurologic:  Alert and  oriented x4;  grossly normal neurologically. Skin:  Intact without significant lesions or rashes. Psych: Normal mood and affect.    Assessment:  23 year old male with history of anxiety, HTN, ADHD, presenting today at the request of Dr. Denman Fischer for further evaluation of GERD.  Patient also reporting dysphagia.  GERD: Few year history of infrequent reflux, but has increased in frequency over the last few months occurring at least 3-4 times a week.  Also with intermittent bloated sensation and early satiety but continues to eat  normally and has had no unintentional weight loss.  He has made dietary changes over the last month due to diagnosis of prediabetes, but continues with fairly frequent reflux symptoms and not currently taking anything on a daily basis.  I will plan to start him on daily PPI and also arrange EGD to evaluate for gastritis, duodenitis, PUD, H. pylori.  Dysphagia: Intermittent solid food dysphagia for the last few months.  Occasional regurgitation.  Symptoms could be related to esophagitis in the setting of GERD versus esophageal web, ring, stricture, or possibly EOE.  Needs EGD for further evaluation.  Elevated alkaline phosphatase: Recent labs in April with PCP showed mildly elevated alkaline phosphatase of 138, other LFTs within normal limits per OV note in referral. I do not have actual lab report to refer to.  He has no symptoms of decompensated liver disease.  No abdominal imaging on file.  No ETOH. As this is the first time alk phos has been elevated, we will plan to follow-up on this at next visit with repeat labs and additional workup if needed.  Of note, he has made significant dietary changes recently due to prediabetes and was encouraged to continue with this.  He also reports  grandfather with history of cirrhosis, alpha-1 antitrypsin deficiency, and already has plan placed to be checked for this.   Plan:  Pantoprazole 40 mg daily. Proceed with upper endoscopy +/- dilation with propofol by Dr. Mordechai April in near future. The risks, benefits, and alternatives have been discussed with the patient in detail. The patient states understanding and desires to proceed.  ASA 2 Follow a GERD diet:  Avoid fried, fatty, greasy, spicy, citrus foods. Avoid caffeine and carbonated beverages. Avoid chocolate. Try eating 4-6 small meals a day rather than 3 large meals. Do not eat within 3 hours of laying down. Prop head of bed up on wood or bricks to create a 6 inch incline. Dysphagia precautions:  Eat slowly, take small bites, chew thoroughly, drink plenty of liquids throughout meals.  Avoid trough textures All meats should be chopped finely.  If something gets hung in your esophagus and will not come up or go down, proceed to the emergency room.   Follow-up after EGD.    Shana Daring, PA-C [facility] 01/04/2024

## 2024-01-04 ENCOUNTER — Encounter: Payer: Self-pay | Admitting: Gastroenterology

## 2024-01-04 ENCOUNTER — Telehealth: Payer: Self-pay | Admitting: *Deleted

## 2024-01-04 ENCOUNTER — Ambulatory Visit: Admitting: Gastroenterology

## 2024-01-04 ENCOUNTER — Encounter: Payer: Self-pay | Admitting: *Deleted

## 2024-01-04 VITALS — BP 133/89 | HR 90 | Temp 97.7°F | Ht 72.0 in | Wt 281.6 lb

## 2024-01-04 DIAGNOSIS — K219 Gastro-esophageal reflux disease without esophagitis: Secondary | ICD-10-CM

## 2024-01-04 DIAGNOSIS — R748 Abnormal levels of other serum enzymes: Secondary | ICD-10-CM | POA: Diagnosis not present

## 2024-01-04 DIAGNOSIS — R1310 Dysphagia, unspecified: Secondary | ICD-10-CM

## 2024-01-04 MED ORDER — PANTOPRAZOLE SODIUM 40 MG PO TBEC: 40.0000 mg | DELAYED_RELEASE_TABLET | Freq: Every day | ORAL | 3 refills | Status: DC

## 2024-01-04 NOTE — Telephone Encounter (Signed)
 [facility] PA:  Transaction Submission Confirmation Case ID# [id number] was submitted on 01-04-2024

## 2024-01-04 NOTE — Patient Instructions (Signed)
 Start pantoprazole 40 mg daily 30 minutes before breakfast.    Follow a GERD diet:  Avoid fried, fatty, greasy, spicy, citrus foods. Avoid caffeine and carbonated beverages. Avoid chocolate. Try eating 4-6 small meals a day rather than 3 large meals. Do not eat within 3 hours of laying down. Prop head of bed up on wood or bricks to create a 6 inch incline.  Swallowing precautions:  Eat slowly, take small bites, chew thoroughly, drink plenty of liquids throughout meals.  Avoid trough textures All meats should be chopped finely.  If something gets hung in your esophagus and will not come up or go down, proceed to the emergency room.     We will get you scheduled for an upper endoscopy with possible stretching of your esophagus with Dr. Mordechai April at [facility].   I will see you back in the office after your upper endoscopy.    Shana Daring, PA-C [facility]

## 2024-01-08 ENCOUNTER — Telehealth: Payer: Self-pay | Admitting: Gastroenterology

## 2024-01-08 NOTE — Telephone Encounter (Signed)
 Patient's mother, Malikhi Coates, sent prior labs for Richard Kelley to me per patient message in her own mychart account.   Labs dated 02/01/20 with alk phos elevated at 144, other LFTs wnl.   Labs dated 04/17/20 with alk phos 145, other LFTs wnl.   No additional recommendations at this time.

## 2024-01-31 ENCOUNTER — Other Ambulatory Visit: Payer: Self-pay

## 2024-01-31 ENCOUNTER — Ambulatory Visit (HOSPITAL_COMMUNITY)
Admission: RE | Admit: 2024-01-31 | Discharge: 2024-01-31 | Disposition: A | Discharge status: Home / Self Care | Source: Ambulatory Visit | Attending: Internal Medicine | Admitting: Internal Medicine

## 2024-01-31 ENCOUNTER — Ambulatory Visit (HOSPITAL_COMMUNITY): Admitting: Anesthesiology

## 2024-01-31 ENCOUNTER — Encounter (HOSPITAL_COMMUNITY): Payer: Self-pay | Admitting: Internal Medicine

## 2024-01-31 ENCOUNTER — Encounter (HOSPITAL_COMMUNITY)
Admission: RE | Disposition: A | Discharge status: Home / Self Care | Payer: Self-pay | Source: Ambulatory Visit | Attending: Internal Medicine

## 2024-01-31 DIAGNOSIS — K222 Esophageal obstruction: Secondary | ICD-10-CM

## 2024-01-31 DIAGNOSIS — R12 Heartburn: Secondary | ICD-10-CM

## 2024-01-31 DIAGNOSIS — Z8379 Family history of other diseases of the digestive system: Secondary | ICD-10-CM | POA: Insufficient documentation

## 2024-01-31 DIAGNOSIS — J45909 Unspecified asthma, uncomplicated: Secondary | ICD-10-CM | POA: Insufficient documentation

## 2024-01-31 DIAGNOSIS — F32A Depression, unspecified: Secondary | ICD-10-CM | POA: Diagnosis not present

## 2024-01-31 DIAGNOSIS — R1310 Dysphagia, unspecified: Secondary | ICD-10-CM | POA: Insufficient documentation

## 2024-01-31 DIAGNOSIS — K2970 Gastritis, unspecified, without bleeding: Secondary | ICD-10-CM | POA: Diagnosis not present

## 2024-01-31 DIAGNOSIS — R748 Abnormal levels of other serum enzymes: Secondary | ICD-10-CM | POA: Diagnosis not present

## 2024-01-31 DIAGNOSIS — K200 Eosinophilic esophagitis: Secondary | ICD-10-CM | POA: Diagnosis not present

## 2024-01-31 DIAGNOSIS — F411 Generalized anxiety disorder: Secondary | ICD-10-CM | POA: Insufficient documentation

## 2024-01-31 DIAGNOSIS — G4730 Sleep apnea, unspecified: Secondary | ICD-10-CM | POA: Insufficient documentation

## 2024-01-31 DIAGNOSIS — K219 Gastro-esophageal reflux disease without esophagitis: Secondary | ICD-10-CM | POA: Diagnosis not present

## 2024-01-31 DIAGNOSIS — K449 Diaphragmatic hernia without obstruction or gangrene: Secondary | ICD-10-CM | POA: Diagnosis not present

## 2024-01-31 DIAGNOSIS — J342 Deviated nasal septum: Secondary | ICD-10-CM | POA: Diagnosis not present

## 2024-01-31 DIAGNOSIS — F909 Attention-deficit hyperactivity disorder, unspecified type: Secondary | ICD-10-CM | POA: Insufficient documentation

## 2024-01-31 HISTORY — DX: Sleep apnea, unspecified: G47.30

## 2024-01-31 HISTORY — PX: ESOPHAGEAL DILATION: SHX303

## 2024-01-31 HISTORY — PX: ESOPHAGOGASTRODUODENOSCOPY: SHX5428

## 2024-01-31 HISTORY — DX: Unspecified asthma, uncomplicated: J45.909

## 2024-01-31 SURGERY — EGD (ESOPHAGOGASTRODUODENOSCOPY)
Anesthesia: General

## 2024-01-31 MED ORDER — PROPOFOL 10 MG/ML IV BOLUS
INTRAVENOUS | Status: DC | PRN
  Administered 2024-01-31 (×6): 50 mg via INTRAVENOUS
  Administered 2024-01-31: 100 mg via INTRAVENOUS
  Administered 2024-01-31: 50 mg via INTRAVENOUS

## 2024-01-31 MED ORDER — LACTATED RINGERS IV SOLN
INTRAVENOUS | Status: DC | PRN
Start: 2024-01-31 — End: 2024-01-31

## 2024-01-31 MED ORDER — LIDOCAINE 2% (20 MG/ML) 5 ML SYRINGE
INTRAMUSCULAR | Status: DC | PRN
  Administered 2024-01-31: 50 mg via INTRAVENOUS

## 2024-01-31 MED ORDER — PANTOPRAZOLE SODIUM 40 MG PO TBEC: 40.0000 mg | DELAYED_RELEASE_TABLET | Freq: Two times a day (BID) | ORAL | 11 refills | Status: DC

## 2024-01-31 MED ORDER — PROPOFOL 500 MG/50ML IV EMUL
INTRAVENOUS | Status: DC | PRN
  Administered 2024-01-31: 150 ug/kg/min via INTRAVENOUS

## 2024-01-31 NOTE — Transfer of Care (Addendum)
 Immediate Anesthesia Transfer of Care Note  Patient: Richard Kelley  Procedure(s) Performed: EGD (ESOPHAGOGASTRODUODENOSCOPY) DILATION, ESOPHAGUS  Patient Location: PACU and Endoscopy Unit  Anesthesia Type:General  Level of Consciousness: awake  Airway & Oxygen Therapy: Patient Spontanous Breathing  Post-op Assessment: Report given to RN  Post vital signs: Reviewed and stable  Last Vitals:  Vitals Value Taken Time  BP 98/62   Temp 36.5   Pulse 92   Resp 13   SpO2 95     Last Pain:  Vitals:   01/31/24 0736  TempSrc:   PainSc: 0-No pain      Patients Stated Pain Goal: 6 (01/31/24 0644)  Complications: No notable events documented.

## 2024-01-31 NOTE — Discharge Instructions (Addendum)
 EGD Discharge instructions Please read the instructions outlined below and refer to this sheet in the next few weeks. These discharge instructions provide you with general information on caring for yourself after you leave the hospital. Your doctor may also give you specific instructions. While your treatment has been planned according to the most current medical practices available, unavoidable complications occasionally occur. If you have any problems or questions after discharge, please call your doctor. ACTIVITY You may resume your regular activity but move at a slower pace for the next 24 hours.  Take frequent rest periods for the next 24 hours.  Walking will help expel (get rid of) the air and reduce the bloated feeling in your abdomen.  No driving for 24 hours (because of the anesthesia (medicine) used during the test).  You may shower.  Do not sign any important legal documents or operate any machinery for 24 hours (because of the anesthesia used during the test).  NUTRITION Drink plenty of fluids.  You may resume your normal diet.  Begin with a light meal and progress to your normal diet.  Avoid alcoholic beverages for 24 hours or as instructed by your caregiver.  MEDICATIONS You may resume your normal medications unless your caregiver tells you otherwise.  WHAT YOU CAN EXPECT TODAY You may experience abdominal discomfort such as a feeling of fullness or "gas" pains.  FOLLOW-UP Your doctor will discuss the results of your test with you.  SEEK IMMEDIATE MEDICAL ATTENTION IF ANY OF THE FOLLOWING OCCUR: Excessive nausea (feeling sick to your stomach) and/or vomiting.  Severe abdominal pain and distention (swelling).  Trouble swallowing.  Temperature over 101 F (37.8 C).  Rectal bleeding or vomiting of blood.   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  I took samples of your esophagus to further evaluate.  You also have a small hiatal hernia as  well as a distal tightening of your esophagus called a Schatzki's ring.  I dilated this today.  Mild inflammation in your stomach which also sampled.  We will call with these results.  Small bowel was normal.  I am going to increase your pantoprazole  to 40 mg twice daily.  Would recommend a 6 food elimination diet.  Follow-up in GI office in 8 weeks.   I hope you have a great rest of your week!  Rolando Cliche. Mordechai April, D.O. Gastroenterology and Hepatology [facility]

## 2024-01-31 NOTE — Interval H&P Note (Signed)
 History and Physical Interval Note:  01/31/2024 7:29 AM  Richard Kelley  has presented today for surgery, with the diagnosis of dysphagia,GERD.  The various methods of treatment have been discussed with the patient and family. After consideration of risks, benefits and other options for treatment, the patient has consented to  Procedure(s) with comments: EGD (ESOPHAGOGASTRODUODENOSCOPY) (N/A) - 11:30 am, asa 2 DILATION, ESOPHAGUS (N/A) as a surgical intervention.  The patient's history has been reviewed, patient examined, no change in status, stable for surgery.  I have reviewed the patient's chart and labs.  Questions were answered to the patient's satisfaction.     Vinetta Greening

## 2024-01-31 NOTE — Anesthesia Postprocedure Evaluation (Signed)
 Anesthesia Post Note  Patient: Richard Kelley  Procedure(s) Performed: EGD (ESOPHAGOGASTRODUODENOSCOPY) DILATION, ESOPHAGUS  Patient location during evaluation: Endoscopy Anesthesia Type: General Level of consciousness: awake and alert Pain management: pain level controlled Vital Signs Assessment: post-procedure vital signs reviewed and stable Respiratory status: spontaneous breathing Cardiovascular status: stable Postop Assessment: no apparent nausea or vomiting Anesthetic complications: no   No notable events documented.   Last Vitals:  Vitals:   01/31/24 0654 01/31/24 0800  BP: 113/68 98/62  Pulse: 77 92  Resp: 17 13  Temp: 36.6 C 36.5 C  SpO2: 95% 95%    Last Pain:  Vitals:   01/31/24 0800  TempSrc: Oral  PainSc: 0-No pain                 Shawnay Bramel

## 2024-01-31 NOTE — Anesthesia Preprocedure Evaluation (Signed)
 Anesthesia Evaluation  Patient identified by MRN, date of birth, ID band Patient awake    Reviewed: Allergy & Precautions, H&P , NPO status , Patient's Chart, lab work & pertinent test results, reviewed documented beta blocker date and time   Airway Mallampati: II  TM Distance: >3 FB Neck ROM: full    Dental no notable dental hx.    Pulmonary neg pulmonary ROS, asthma , sleep apnea    Pulmonary exam normal breath sounds clear to auscultation       Cardiovascular Exercise Tolerance: Good hypertension, negative cardio ROS  Rhythm:regular Rate:Normal     Neuro/Psych  PSYCHIATRIC DISORDERS Anxiety Depression    negative neurological ROS  negative psych ROS   GI/Hepatic negative GI ROS, Neg liver ROS,,,  Endo/Other  negative endocrine ROS    Renal/GU negative Renal ROS  negative genitourinary   Musculoskeletal   Abdominal   Peds  Hematology negative hematology ROS (+)   Anesthesia Other Findings   Reproductive/Obstetrics negative OB ROS                             Anesthesia Physical Anesthesia Plan  ASA: 2  Anesthesia Plan: General   Post-op Pain Management:    Induction:   PONV Risk Score and Plan: Propofol infusion  Airway Management Planned:   Additional Equipment:   Intra-op Plan:   Post-operative Plan:   Informed Consent: I have reviewed the patients History and Physical, chart, labs and discussed the procedure including the risks, benefits and alternatives for the proposed anesthesia with the patient or authorized representative who has indicated his/her understanding and acceptance.     Dental Advisory Given  Plan Discussed with: CRNA  Anesthesia Plan Comments:        Anesthesia Quick Evaluation

## 2024-01-31 NOTE — Op Note (Signed)
 [facility] Patient Name: Richard Kelley Procedure Date: 01/31/2024 7:20 AM MRN: [id number] Date of Birth: 2000/12/18 Attending MD: Rolando Cliche. Mordechai April , [place], [id number] CSN: [id number] Age: 23 Admit Type: Outpatient Procedure:                Upper GI endoscopy Indications:              Dysphagia, Heartburn Providers:                Rolando Cliche. Mordechai April, DO, Troy Furnish. Museum/gallery exhibitions officer, Charity fundraiser,                            Wilfredo Hanly. Roberta Chin, Technician Referring MD:              Medicines:                See the Anesthesia note for documentation of the                            administered medications Complications:            No immediate complications. Estimated Blood Loss:     Estimated blood loss was minimal. Procedure:                Pre-Anesthesia Assessment:                           - The anesthesia plan was to use monitored                            anesthesia care (MAC).                           After obtaining informed consent, the endoscope was                            passed under direct vision. Throughout the                            procedure, the patient's blood pressure, pulse, and                            oxygen saturations were monitored continuously. The                            [id number]-H190 ([id number]) scope was introduced through the                            mouth, and advanced to the second part of duodenum.                            The upper GI endoscopy was accomplished without                            difficulty. The patient tolerated the procedure                            well.  Scope In: 7:42:07 AM Scope Out: 7:49:59 AM Total Procedure Duration: 0 hours 7 minutes 52 seconds  Findings:      Mucosal changes including ringed esophagus, feline appearance and       longitudinal furrows were found in the middle third of the esophagus.       Esophageal findings were graded using the Eosinophilic Esophagitis       Endoscopic Reference Score (EoE-EREFS) as: Edema  Grade 1 Present       (decreased clarity or absence of vascular markings), Rings Grade 2       Moderate (distinct rings that do not occlude passage of diagnostic 8-10       mm endoscope), Exudates Grade 0 None (no white lesions seen), Furrows       Grade 1 Mild (vertical lines without visible depth) and Stricture       present. Biopsies were taken with a cold forceps for histology.      A small hiatal hernia was present.      A mild Schatzki ring was found in the distal esophagus. A TTS dilator       was passed through the scope. Dilation with an 18-19-20 mm balloon       dilator was performed to 19 mm. The dilation site was examined and       showed mild mucosal disruption and moderate improvement in luminal       narrowing.      Patchy mild inflammation characterized by erythema was found in the       gastric body and in the gastric antrum. Biopsies were taken with a cold       forceps for Helicobacter pylori testing.      The duodenal bulb, first portion of the duodenum and second portion of       the duodenum were normal. Impression:               - Esophageal mucosal changes consistent with                            eosinophilic esophagitis. Biopsied.                           - Small hiatal hernia.                           - Mild Schatzki ring. Dilated.                           - Gastritis. Biopsied.                           - Normal duodenal bulb, first portion of the                            duodenum and second portion of the duodenum. Moderate Sedation:      Per Anesthesia Care Recommendation:           - Patient has a contact number available for                            emergencies. The signs and symptoms of potential  delayed complications were discussed with the                            patient. Return to normal activities tomorrow.                            Written discharge instructions were provided to the                             patient.                           - Resume previous diet.                           - Continue present medications.                           - Await pathology results.                           - Repeat upper endoscopy for surveillance based on                            pathology results.                           - Return to GI clinic in 8 weeks.                           - Use Protonix  (pantoprazole ) 40 mg PO BID.                           - 6 Food elimination diet. Procedure Code(s):        --- Professional ---                           [phone], Esophagogastroduodenoscopy, flexible,                            transoral; with transendoscopic balloon dilation of                            esophagus (less than 30 mm diameter)                           43239, 59, Esophagogastroduodenoscopy, flexible,                            transoral; with biopsy, single or multiple Diagnosis Code(s):        --- Professional ---                           K22.89, Other specified disease of esophagus                           K44.9, Diaphragmatic hernia without obstruction or  gangrene                           K22.2, Esophageal obstruction                           K29.70, Gastritis, unspecified, without bleeding                           R13.10, Dysphagia, unspecified                           R12, Heartburn CPT copyright 2022 American Medical Association. All rights reserved. The codes documented in this report are preliminary and upon coder review may  be revised to meet current compliance requirements. Rolando Cliche. Mordechai April, DO Rolando Cliche. Mordechai April, DO 01/31/2024 8:03:58 AM This report has been signed electronically. Number of Addenda: 0

## 2024-02-01 ENCOUNTER — Encounter (HOSPITAL_COMMUNITY): Payer: Self-pay | Admitting: Internal Medicine

## 2024-02-01 LAB — SURGICAL PATHOLOGY

## 2024-02-02 ENCOUNTER — Ambulatory Visit: Payer: Self-pay | Admitting: Internal Medicine

## 2024-02-14 ENCOUNTER — Telehealth: Payer: Self-pay | Admitting: Physician Assistant

## 2024-02-14 NOTE — Telephone Encounter (Signed)
 Yes, 30 minutes is fine. Thanks

## 2024-02-14 NOTE — Telephone Encounter (Signed)
 Please see message and advise

## 2024-02-14 NOTE — Telephone Encounter (Signed)
 Pt called at 1:12 pm requesting an apt with T.Hurst. Last apt 10/08/22. Will you see this pt? If so how long do you need?     Will advise pt [phone]

## 2024-02-29 ENCOUNTER — Ambulatory Visit (INDEPENDENT_AMBULATORY_CARE_PROVIDER_SITE_OTHER): Admitting: Otolaryngology

## 2024-02-29 ENCOUNTER — Encounter (INDEPENDENT_AMBULATORY_CARE_PROVIDER_SITE_OTHER): Payer: Self-pay | Admitting: Otolaryngology

## 2024-02-29 VITALS — BP 127/82 | HR 90 | Ht 71.0 in | Wt 280.0 lb

## 2024-02-29 DIAGNOSIS — J310 Chronic rhinitis: Secondary | ICD-10-CM

## 2024-02-29 DIAGNOSIS — J3489 Other specified disorders of nose and nasal sinuses: Secondary | ICD-10-CM

## 2024-02-29 DIAGNOSIS — J343 Hypertrophy of nasal turbinates: Secondary | ICD-10-CM | POA: Diagnosis not present

## 2024-02-29 DIAGNOSIS — J342 Deviated nasal septum: Secondary | ICD-10-CM | POA: Diagnosis not present

## 2024-02-29 DIAGNOSIS — G4733 Obstructive sleep apnea (adult) (pediatric): Secondary | ICD-10-CM | POA: Diagnosis not present

## 2024-03-01 DIAGNOSIS — J343 Hypertrophy of nasal turbinates: Secondary | ICD-10-CM | POA: Insufficient documentation

## 2024-03-01 DIAGNOSIS — J342 Deviated nasal septum: Secondary | ICD-10-CM | POA: Insufficient documentation

## 2024-03-01 DIAGNOSIS — G4733 Obstructive sleep apnea (adult) (pediatric): Secondary | ICD-10-CM | POA: Insufficient documentation

## 2024-03-01 DIAGNOSIS — J310 Chronic rhinitis: Secondary | ICD-10-CM | POA: Insufficient documentation

## 2024-03-01 NOTE — Progress Notes (Signed)
 CC: Chronic nasal obstruction  HPI:  Richard Kelley is a 23 y.o. male who presents today with his father for evaluation of his chronic nasal obstruction. - Presents for evaluation of nasal obstruction, which has been present for 5+ years. - Reports two past incidents of nasal trauma: one in elementary school from self-inflicted impact causing bleeding, and another from being hit with a football a few years later.  - Has tried Flonase nasal spray in the past but found it did not provide significant relief. Currently takes Zyrtec 24-hour for seasonal allergies. - Reports no recent sinus infections. - Previously seen in 2021 and diagnosed with a deviated septum. - Recently diagnosed with obstructive sleep apnea following both an at-home and in-house sleep study. The initial study showed approximately 70 obstructive events per hour. A subsequent study with a CPAP machine showed that obstructive events were almost non-existent, but approximately 20 central apnea events per hour persisted. - Awaiting his CPAP machine. - No prior history of ear, nose, or throat surgery. - Denies being on any blood thinners.  Past Medical History:  Diagnosis Date   ADHD    Anxiety    Asthma    Hypertension    Sleep apnea     Past Surgical History:  Procedure Laterality Date   ESOPHAGEAL DILATION N/A 01/31/2024   Procedure: DILATION, ESOPHAGUS;  Surgeon: Cindie Carlin POUR, DO;  Location: [facility];  Service: Endoscopy;  Laterality: N/A;   ESOPHAGOGASTRODUODENOSCOPY N/A 01/31/2024   Procedure: EGD (ESOPHAGOGASTRODUODENOSCOPY);  Surgeon: Cindie Carlin POUR, DO;  Location: [facility];  Service: Endoscopy;  Laterality: N/A;  11:30 am, asa 2   No prior surgery      Family History  Problem Relation Age of Onset   Irregular heart beat Mother    Irregular heart beat Father    Hypertension Maternal Grandmother    Alpha-1 antitrypsin deficiency Paternal Grandfather    Cirrhosis Paternal Grandfather     Social  History:  reports that he has never smoked. He has never used smokeless tobacco. He reports that he does not drink alcohol and does not use drugs.  Allergies: No Known Allergies  Prior to Admission medications   Medication Sig Start Date End Date Taking? Authorizing Provider  acetaminophen (TYLENOL) 500 MG tablet Take 500 mg by mouth every 6 (six) hours as needed (for pain.).   Yes [provider]  albuterol (VENTOLIN HFA) 108 (90 Base) MCG/ACT inhaler Inhale 1-2 puffs into the lungs every 6 (six) hours as needed for wheezing or shortness of breath.   Yes [provider]  calcium carbonate (TUMS - DOSED IN MG ELEMENTAL CALCIUM) 500 MG chewable tablet Chew 2 tablets by mouth daily as needed for indigestion or heartburn.   Yes [provider]  cetirizine (ZYRTEC) 10 MG chewable tablet Chew 10 mg by mouth daily.   Yes [provider]  cloNIDine  (CATAPRES ) 0.2 MG tablet Take 1 tablet twice a day by oral route for 90 days. 12/27/23  Yes [provider]  dexmethylphenidate  (FOCALIN  XR) 20 MG 24 hr capsule Take 1 capsule (20 mg total) by mouth daily. 10/08/22  Yes Hurst, Verneita DASEN, PA-C  escitalopram  (LEXAPRO ) 20 MG tablet Take 1 tablet (20 mg total) by mouth at bedtime. 10/08/22  Yes Rhys Verneita T, PA-C  ibuprofen (ADVIL) 200 MG tablet Take 400 mg by mouth every 8 (eight) hours as needed (for pain.).   Yes [provider]  pantoprazole  (PROTONIX ) 40 MG tablet Take 1  tablet (40 mg total) by mouth 2 (two) times daily. 01/31/24 01/30/25 Yes Carver, Carlin POUR, DO    Blood pressure 127/82, pulse 90, height 5' 11 (1.803 m), weight 280 lb (127 kg), SpO2 96%. Exam: General: Communicates without difficulty, well nourished, no acute distress. Head: Normocephalic, no evidence injury, no tenderness, facial buttresses intact without stepoff. Face/sinus: No tenderness to palpation and percussion. Facial movement is normal and symmetric. Eyes: PERRL, EOMI. No scleral  icterus, conjunctivae clear. Neuro: CN II exam reveals vision grossly intact.  No nystagmus at any point of gaze. Ears: Auricles well formed without lesions.  Ear canals are intact without mass or lesion.  No erythema or edema is appreciated.  The TMs are intact without fluid. Nose: External evaluation reveals normal support and skin without lesions.  Dorsum is intact.  Anterior rhinoscopy reveals congested mucosa over anterior aspect of inferior turbinates and intact septum.  No purulence noted. Oral:  Oral cavity and oropharynx are intact, symmetric, without erythema or edema.  Mucosa is moist without lesions. Neck: Full range of motion without pain.  There is no significant lymphadenopathy.  No masses palpable.  Thyroid  bed within normal limits to palpation.  Parotid glands and submandibular glands equal bilaterally without mass.  Trachea is midline. Neuro:  CN 2-12 grossly intact.   Procedure:  Flexible Nasal Endoscopy: Description: Risks, benefits, and alternatives of flexible endoscopy were explained to the patient.  Specific mention was made of the risk of throat numbness with difficulty swallowing, possible bleeding from the nose and mouth, and pain from the procedure.  The patient gave oral consent to proceed.  The flexible scope was inserted into the right nasal cavity.  Endoscopy of the interior nasal cavity, superior, inferior, and middle meatus was performed. The sphenoid-ethmoid recess was examined. Edematous mucosa was noted.  No polyp, mass, or lesion was appreciated. Nasal septal deviation noted. Olfactory cleft was clear.  Nasopharynx was clear.  Turbinates were hypertrophied but without mass.  The procedure was repeated on the contralateral side with similar findings.  The patient tolerated the procedure well.   Assessment: - Severe nasal obstruction secondary to deviated nasal septum with a bony spur and bilateral inferior turbinate hypertrophy.  More than 95% of his nasal passageways are  obstructed bilaterally. - Obstructive sleep apnea.  Plan: - The physical exam and nasal endoscopy findings are reviewed with the patient. - Continue with his current allergy treatment regimen.  Nasal saline irrigation is encouraged. - The treatment options are extensively discussed.  The options include continuing conservative observation with medical therapy versus surgical intervention with septoplasty and turbinate reduction. - The risks, benefits, alternatives, and details of the surgical options are extensively reviewed.  It was explained that the surgery is an outpatient procedure lasting approximately 45-60 minutes, with all incisions made internally. Post-operatively, splints will be placed inside the nose for about three days, after which they will be removed in the office. It was clarified that no nasal packing will be used. - The patient is interested in proceeding with the procedures.  We will schedule the procedures in accordance with the family schedule.  Rotha Cassels W [place] Hudman 03/01/2024, 8:39 AM

## 2024-03-19 ENCOUNTER — Encounter: Payer: Self-pay | Admitting: Physician Assistant

## 2024-03-19 ENCOUNTER — Ambulatory Visit (INDEPENDENT_AMBULATORY_CARE_PROVIDER_SITE_OTHER): Payer: Self-pay | Admitting: Physician Assistant

## 2024-03-19 DIAGNOSIS — F422 Mixed obsessional thoughts and acts: Secondary | ICD-10-CM

## 2024-03-19 DIAGNOSIS — F902 Attention-deficit hyperactivity disorder, combined type: Secondary | ICD-10-CM | POA: Diagnosis not present

## 2024-03-19 MED ORDER — DEXMETHYLPHENIDATE HCL ER 35 MG PO CP24: 35.0000 mg | ORAL_CAPSULE | Freq: Every morning | ORAL | 0 refills | Status: DC

## 2024-03-19 NOTE — Progress Notes (Signed)
 [facility]  Patient ID: Richard Kelley,  MRN: [id number]  PCP: Shona Norleen PEDLAR, MD  Date of Evaluation: 03/19/2024 Time spent:20 minutes  Chief Complaint:  Chief Complaint   ADHD    HISTORY/CURRENT STATUS: HPI Lost to follow up for a year  Needs to discuss Focalin .  He's going back to college and needs help with focus. States he's been taking an old Rx from 2023, he thinks, and it's not effective. Hasn't been taking it often until recently. Hasn't been prescribed it, or the Lexapro , from any other provider. Says the Focalin  helps him focus on some things, but he's 'too focused, but then his mind goes off the rails and starts thinking about something else.' He wants the Focalin  to help that. He notices that he'll obsess about things, like whether he's washed a glass well enough or not, but then he can't focus on the dishes.  Denies feeling nervous and/or restless. No sense of impending doom. Is able to control worry.  Denies tendency to avoid of things that may trigger anxiety. No tachycardia, palpitations, SOB, sweating, or trembling.   Patient is able to enjoy things.  Energy and motivation are good.  Not working.  No extreme sadness, tearfulness, or feelings of hopelessness.  Sleeps ok.  ADLs and personal hygiene are normal.   Appetite has not changed.  Weight is stable.  No mania, delirium, AH/VH.  No SI/HI.  Denies dizziness, syncope, seizures, numbness, tingling, tremor, tics, unsteady gait, slurred speech, confusion. Denies muscle or joint pain, stiffness, or dystonia.  Individual Medical History/ Review of Systems: Changes? :No   Past medications for mental health diagnoses include: Adderall, Vyvanse caused depression, Ritalin, Lexapro , Wellbutrin made him dazed and disoriented, Clonodine, Perphenazine   Allergies: Patient has no known allergies.  Current Medications:  Current Outpatient Medications:    cetirizine (ZYRTEC) 10 MG chewable tablet, Chew 10 mg by mouth  daily., Disp: , Rfl:    cloNIDine  (CATAPRES ) 0.2 MG tablet, Take 1 tablet twice a day by oral route for 90 days., Disp: , Rfl:    Dexmethylphenidate  HCl 35 MG CP24, Take 35 mg by mouth in the morning., Disp: 30 capsule, Rfl: 0   escitalopram  (LEXAPRO ) 20 MG tablet, Take 1 tablet (20 mg total) by mouth at bedtime., Disp: 90 tablet, Rfl: 1   pantoprazole  (PROTONIX ) 40 MG tablet, Take 1 tablet (40 mg total) by mouth 2 (two) times daily., Disp: 60 tablet, Rfl: 11   acetaminophen (TYLENOL) 500 MG tablet, Take 500 mg by mouth every 6 (six) hours as needed (for pain.)., Disp: , Rfl:    albuterol (VENTOLIN HFA) 108 (90 Base) MCG/ACT inhaler, Inhale 1-2 puffs into the lungs every 6 (six) hours as needed for wheezing or shortness of breath., Disp: , Rfl:    calcium carbonate (TUMS - DOSED IN MG ELEMENTAL CALCIUM) 500 MG chewable tablet, Chew 2 tablets by mouth daily as needed for indigestion or heartburn., Disp: , Rfl:    ibuprofen (ADVIL) 200 MG tablet, Take 400 mg by mouth every 8 (eight) hours as needed (for pain.)., Disp: , Rfl:  Medication Side Effects: none  Family Medical/ Social History: Changes?  No  MENTAL HEALTH EXAM:  There were no vitals taken for this visit.There is no height or weight on file to calculate BMI.  General Appearance: Casual, Well Groomed, and Obese  Eye Contact:  Good  Speech:  Clear and Coherent and Normal Rate  Volume:  Normal  Mood:  Euthymic  Affect:  Congruent  Thought Process:  Goal Directed and Descriptions of Associations: Circumstantial  Orientation:  Full (Time, Place, and Person)  Thought Content: Logical   Suicidal Thoughts:  No  Homicidal Thoughts:  No  Memory:  WNL  Judgement:  Good  Insight:  Good  Psychomotor Activity:  Normal  Concentration:  Concentration: Good and Attention Span: Fair  Recall:  Good  Fund of Knowledge: Good  Language: Good  Assets:  Desire for Improvement Financial  Resources/Insurance Housing Transportation Vocational/Educational  ADL's:  Intact  Cognition: WNL  Prognosis:  Good   DIAGNOSES:    ICD-10-CM   1. Attention deficit hyperactivity disorder, combined type  F90.2     2. Mixed obsessional thoughts and acts  F42.2      Receiving Psychotherapy: No   RECOMMENDATIONS:  PDMP was reviewed.  Last Focalin  filled 12/05/2022. I provided approximately  20 minutes of face to face time during this encounter, including time spent before and after the visit in records review, medical decision making, counseling pertinent to today's visit, and charting.   I'm unsure how he's had enough Focalin  and Lexapro  to last this long.  Pt states he hasn't gotten a prescription from anyone else. At any rate, he reports taking 30 mg of Focalin , although an old Rx and it's no longer effective.  I recommend increasing the dose to 35 mg. We discussed the fact that any stimulant isn't going to help focus on certain things and not others. Behavioral techniques were recommended to help prevent easy distractions.   Bring all meds with him to next OV.   Increase Focalin  XR to 35 mg, 1 p.o. every morning. Continue Lexapro  20 mg, 1 po qhs.  Return in 6 weeks.    Verneita Cooks, PA-C

## 2024-03-28 ENCOUNTER — Encounter (HOSPITAL_COMMUNITY): Payer: Self-pay | Admitting: Otolaryngology

## 2024-03-28 ENCOUNTER — Other Ambulatory Visit: Payer: Self-pay

## 2024-03-28 ENCOUNTER — Ambulatory Visit: Admitting: Cardiology

## 2024-03-28 NOTE — Progress Notes (Signed)
 PCP - Dr. Norleen PEDLAR. [place] Cardiologist - denies  PPM/ICD - denies   Chest x-ray - 08/24/05 EKG - 04/17/20 (n/a) Stress Test - denies ECHO - 05/21/20 Cardiac Cath - denies  CPAP - OSA+, pt says he was never given a CPAP machine  DM- denies  ASA/Blood Thinner Instructions: n/a   ERAS Protcol - clears until 10:00  COVID TEST- n/a  Anesthesia review: no  Patient verbally denies any shortness of breath, fever, cough and chest pain during phone call     Questions were answered. Patient verbalized understanding of instructions.

## 2024-03-28 NOTE — Pre-Procedure Instructions (Signed)
-------------    SDW INSTRUCTIONS given:  Your procedure is scheduled on 8/1.  Report to [facility] Main Entrance A at 10:30 A.M., and check in at the Admitting office.  Any questions or running late day of surgery: call [phone]    Remember:  Do not eat after midnight the night before your surgery  You may drink clear liquids until 10:00 AM the morning of your surgery.   Clear liquids allowed are: Water, Non-Citrus Juices (without pulp), Carbonated Beverages, Clear Tea, Black Coffee Only, and Gatorade    Take these medicines the morning of surgery with A SIP OF WATER  zyrtec, clonidine , protonix            May take these medicines IF NEEDED: tylenol, albuterol   As of today, STOP taking any Aspirin (unless otherwise instructed by your surgeon) Aleve, Naproxen, Ibuprofen, Motrin, Advil, Goody's, BC's, all herbal medications, fish oil, and all vitamins.   Do NOT Smoke (Tobacco/Vaping) 24 hours prior to your procedure  If you use a CPAP at night, you may bring all equipment for your overnight stay.     You will be asked to remove any contacts, glasses, piercing's, hearing aid's, dentures/partials prior to surgery. Please bring cases for these items if needed.     Patients discharged the day of surgery will not be allowed to drive home, and someone needs to stay with them for 24 hours.  SURGICAL WAITING ROOM VISITATION Patients may have no more than 2 support people in the waiting area - these visitors may rotate.   Pre-op nurse will coordinate an appropriate time for 1 ADULT support person, who may not rotate, to accompany patient in pre-op.  Children under the age of 37 must have an adult with them who is not the patient and must remain in the main waiting area with an adult.  If the patient needs to stay at the hospital during part of their recovery, the visitor guidelines for inpatient rooms apply.  Please refer to the [facility] website for the visitor guidelines for any  additional information.   Special instructions:   [facility]- Preparing For Surgery   Please follow these instructions carefully.   Shower the NIGHT BEFORE SURGERY and the MORNING OF SURGERY with DIAL Soap.   Pat yourself dry with a CLEAN TOWEL.  Wear CLEAN PAJAMAS to bed the night before surgery  Place CLEAN SHEETS on your bed the night of your first shower and DO NOT SLEEP WITH PETS.   Additional instructions for the day of surgery: DO NOT APPLY any lotions, deodorants, cologne, or perfumes.   Do not wear jewelry or makeup Do not wear nail polish, gel polish, artificial nails, or any other type of covering on natural nails (fingers and toes) Do not bring valuables to the hospital. [facility] is not responsible for valuables/personal belongings. Put on clean/comfortable clothes.  Please brush your teeth.  Ask your nurse before applying any prescription medications to the skin.

## 2024-03-30 ENCOUNTER — Other Ambulatory Visit: Payer: Self-pay

## 2024-03-30 ENCOUNTER — Ambulatory Visit (HOSPITAL_COMMUNITY): Admitting: Certified Registered"

## 2024-03-30 ENCOUNTER — Encounter (HOSPITAL_COMMUNITY): Payer: Self-pay | Admitting: Otolaryngology

## 2024-03-30 ENCOUNTER — Encounter (HOSPITAL_COMMUNITY)
Admission: RE | Disposition: A | Discharge status: Home / Self Care | Payer: Self-pay | Source: Home / Self Care | Attending: Otolaryngology

## 2024-03-30 ENCOUNTER — Ambulatory Visit (HOSPITAL_BASED_OUTPATIENT_CLINIC_OR_DEPARTMENT_OTHER): Admitting: Certified Registered"

## 2024-03-30 ENCOUNTER — Ambulatory Visit (HOSPITAL_COMMUNITY)
Admission: RE | Admit: 2024-03-30 | Discharge: 2024-03-30 | Disposition: A | Discharge status: Home / Self Care | Attending: Otolaryngology | Admitting: Otolaryngology

## 2024-03-30 DIAGNOSIS — J3489 Other specified disorders of nose and nasal sinuses: Secondary | ICD-10-CM | POA: Insufficient documentation

## 2024-03-30 DIAGNOSIS — I10 Essential (primary) hypertension: Secondary | ICD-10-CM | POA: Insufficient documentation

## 2024-03-30 DIAGNOSIS — F418 Other specified anxiety disorders: Secondary | ICD-10-CM | POA: Diagnosis not present

## 2024-03-30 DIAGNOSIS — G4733 Obstructive sleep apnea (adult) (pediatric): Secondary | ICD-10-CM | POA: Insufficient documentation

## 2024-03-30 DIAGNOSIS — J343 Hypertrophy of nasal turbinates: Secondary | ICD-10-CM | POA: Insufficient documentation

## 2024-03-30 DIAGNOSIS — J45909 Unspecified asthma, uncomplicated: Secondary | ICD-10-CM | POA: Insufficient documentation

## 2024-03-30 DIAGNOSIS — J342 Deviated nasal septum: Secondary | ICD-10-CM | POA: Diagnosis present

## 2024-03-30 HISTORY — PX: NASAL SEPTOPLASTY W/ TURBINOPLASTY: SHX2070

## 2024-03-30 HISTORY — DX: Family history of other specified conditions: Z84.89

## 2024-03-30 LAB — BASIC METABOLIC PANEL WITH GFR
Anion gap: 10 (ref 5–15)
BUN: 12 mg/dL (ref 6–20)
CO2: 22 mmol/L (ref 22–32)
Calcium: 8.7 mg/dL — ABNORMAL LOW (ref 8.9–10.3)
Chloride: 104 mmol/L (ref 98–111)
Creatinine, Ser: 0.91 mg/dL (ref 0.61–1.24)
GFR, Estimated: >60 mL/min (ref >60)
Glucose, Bld: 97 mg/dL (ref 70–99)
Potassium: 4.1 mmol/L (ref 3.5–5.1)
Sodium: 136 mmol/L (ref 135–145)

## 2024-03-30 LAB — CBC
HCT: 48.6 % (ref 39.0–52.0)
Hemoglobin: 16 g/dL (ref 13.0–17.0)
MCH: 27.8 pg (ref 26.0–34.0)
MCHC: 32.9 g/dL (ref 30.0–36.0)
MCV: 84.4 fL (ref 80.0–100.0)
Platelets: 284 K/uL (ref 150–400)
RBC: 5.76 MIL/uL (ref 4.22–5.81)
RDW: 12.4 % (ref 11.5–15.5)
WBC: 9.4 K/uL (ref 4.0–10.5)
nRBC: 0 % (ref 0.0–0.2)

## 2024-03-30 SURGERY — SEPTOPLASTY, NOSE, WITH NASAL TURBINATE REDUCTION
Anesthesia: General | Site: Nose | Laterality: Bilateral

## 2024-03-30 MED ORDER — ESMOLOL HCL 100 MG/10ML IV SOLN
INTRAVENOUS | Status: AC
  Filled 2024-03-30: qty 10

## 2024-03-30 MED ORDER — OXYCODONE HCL 5 MG PO TABS
ORAL_TABLET | ORAL | Status: AC
Start: 2024-03-30 — End: 2024-03-30
  Filled 2024-03-30: qty 1

## 2024-03-30 MED ORDER — LIDOCAINE-EPINEPHRINE 1 %-1:100000 IJ SOLN
INTRAMUSCULAR | Status: DC | PRN
  Administered 2024-03-30: 5 mL

## 2024-03-30 MED ORDER — ACETAMINOPHEN 10 MG/ML IV SOLN
INTRAVENOUS | Status: AC
  Filled 2024-03-30: qty 100

## 2024-03-30 MED ORDER — LIDOCAINE 2% (20 MG/ML) 5 ML SYRINGE
INTRAMUSCULAR | Status: DC | PRN
  Administered 2024-03-30: 100 mg via INTRAVENOUS
  Administered 2024-03-30: 60 mg via INTRAVENOUS

## 2024-03-30 MED ORDER — FENTANYL CITRATE (PF) 100 MCG/2ML IJ SOLN: 25.0000 ug | INTRAMUSCULAR | Status: DC | PRN

## 2024-03-30 MED ORDER — SUGAMMADEX SODIUM 200 MG/2ML IV SOLN
INTRAVENOUS | Status: DC | PRN
  Administered 2024-03-30: 400 mg via INTRAVENOUS

## 2024-03-30 MED ORDER — LACTATED RINGERS IV SOLN: INTRAVENOUS | Status: DC

## 2024-03-30 MED ORDER — PROPOFOL 10 MG/ML IV BOLUS
INTRAVENOUS | Status: AC
  Filled 2024-03-30: qty 20

## 2024-03-30 MED ORDER — DEXMEDETOMIDINE HCL IN NACL 80 MCG/20ML IV SOLN
INTRAVENOUS | Status: AC
  Filled 2024-03-30: qty 20

## 2024-03-30 MED ORDER — MIDAZOLAM HCL 2 MG/2ML IJ SOLN
INTRAMUSCULAR | Status: AC
  Filled 2024-03-30: qty 2

## 2024-03-30 MED ORDER — GLYCOPYRROLATE PF 0.2 MG/ML IJ SOSY
PREFILLED_SYRINGE | INTRAMUSCULAR | Status: AC
  Filled 2024-03-30: qty 1

## 2024-03-30 MED ORDER — BACITRACIN ZINC 500 UNIT/GM EX OINT
TOPICAL_OINTMENT | CUTANEOUS | Status: DC | PRN
Start: 2024-03-30 — End: 2024-03-30
  Administered 2024-03-30: 1 via TOPICAL

## 2024-03-30 MED ORDER — DEXTROSE 5 % IV SOLN
INTRAVENOUS | Status: DC | PRN
  Administered 2024-03-30: 3 g via INTRAVENOUS

## 2024-03-30 MED ORDER — FENTANYL CITRATE (PF) 250 MCG/5ML IJ SOLN
INTRAMUSCULAR | Status: AC
Start: 2024-03-30 — End: 2024-03-30
  Filled 2024-03-30: qty 5

## 2024-03-30 MED ORDER — ROCURONIUM BROMIDE 10 MG/ML (PF) SYRINGE
PREFILLED_SYRINGE | INTRAVENOUS | Status: DC | PRN
  Administered 2024-03-30: 70 mg via INTRAVENOUS
  Administered 2024-03-30: 30 mg via INTRAVENOUS

## 2024-03-30 MED ORDER — 0.9 % SODIUM CHLORIDE (POUR BTL) OPTIME
TOPICAL | Status: DC | PRN
  Administered 2024-03-30: 1000 mL

## 2024-03-30 MED ORDER — BACITRACIN ZINC 500 UNIT/GM EX OINT
TOPICAL_OINTMENT | CUTANEOUS | Status: AC
  Filled 2024-03-30: qty 28.35

## 2024-03-30 MED ORDER — PHENYLEPHRINE HCL-NACL 20-0.9 MG/250ML-% IV SOLN: INTRAVENOUS | Status: DC | PRN

## 2024-03-30 MED ORDER — PROPOFOL 500 MG/50ML IV EMUL
INTRAVENOUS | Status: DC | PRN
  Administered 2024-03-30: 125 ug/kg/min via INTRAVENOUS

## 2024-03-30 MED ORDER — GLYCOPYRROLATE PF 0.2 MG/ML IJ SOSY
PREFILLED_SYRINGE | INTRAMUSCULAR | Status: DC | PRN
  Administered 2024-03-30: .2 mg via INTRAVENOUS

## 2024-03-30 MED ORDER — ACETAMINOPHEN 10 MG/ML IV SOLN: 1000.0000 mg | Freq: Once | INTRAVENOUS | Status: DC | PRN

## 2024-03-30 MED ORDER — MIDAZOLAM HCL 2 MG/2ML IJ SOLN
INTRAMUSCULAR | Status: DC | PRN
  Administered 2024-03-30: 2 mg via INTRAVENOUS

## 2024-03-30 MED ORDER — ONDANSETRON HCL 4 MG/2ML IJ SOLN: 4.0000 mg | Freq: Once | INTRAMUSCULAR | Status: DC | PRN

## 2024-03-30 MED ORDER — ESMOLOL HCL 100 MG/10ML IV SOLN
INTRAVENOUS | Status: DC | PRN
  Administered 2024-03-30: 20 mg via INTRAVENOUS

## 2024-03-30 MED ORDER — ACETAMINOPHEN 10 MG/ML IV SOLN
INTRAVENOUS | Status: DC | PRN
  Administered 2024-03-30: 1000 mg via INTRAVENOUS

## 2024-03-30 MED ORDER — OXYCODONE HCL 5 MG PO TABS
5.0000 mg | ORAL_TABLET | Freq: Once | ORAL | Status: AC | PRN
  Administered 2024-03-30: 5 mg via ORAL

## 2024-03-30 MED ORDER — DEXAMETHASONE SODIUM PHOSPHATE 10 MG/ML IJ SOLN
INTRAMUSCULAR | Status: AC
  Filled 2024-03-30: qty 1

## 2024-03-30 MED ORDER — LIDOCAINE-EPINEPHRINE 1 %-1:100000 IJ SOLN
INTRAMUSCULAR | Status: AC
  Filled 2024-03-30: qty 1

## 2024-03-30 MED ORDER — PHENYLEPHRINE 80 MCG/ML (10ML) SYRINGE FOR IV PUSH (FOR BLOOD PRESSURE SUPPORT)
PREFILLED_SYRINGE | INTRAVENOUS | Status: DC | PRN
  Administered 2024-03-30: 40 ug via INTRAVENOUS

## 2024-03-30 MED ORDER — LIDOCAINE 2% (20 MG/ML) 5 ML SYRINGE
INTRAMUSCULAR | Status: AC
Start: 2024-03-30 — End: 2024-03-30
  Filled 2024-03-30: qty 5

## 2024-03-30 MED ORDER — PROPOFOL 10 MG/ML IV BOLUS
INTRAVENOUS | Status: DC | PRN
  Administered 2024-03-30: 200 mg via INTRAVENOUS

## 2024-03-30 MED ORDER — ONDANSETRON HCL 4 MG/2ML IJ SOLN
INTRAMUSCULAR | Status: DC | PRN
  Administered 2024-03-30: 4 mg via INTRAVENOUS

## 2024-03-30 MED ORDER — FENTANYL CITRATE (PF) 250 MCG/5ML IJ SOLN
INTRAMUSCULAR | Status: DC | PRN
  Administered 2024-03-30 (×3): 50 ug via INTRAVENOUS

## 2024-03-30 MED ORDER — ONDANSETRON HCL 4 MG/2ML IJ SOLN
INTRAMUSCULAR | Status: AC
  Filled 2024-03-30: qty 2

## 2024-03-30 MED ORDER — CHLORHEXIDINE GLUCONATE 0.12 % MT SOLN
15.0000 mL | Freq: Once | OROMUCOSAL | Status: AC
  Administered 2024-03-30: 15 mL via OROMUCOSAL
  Filled 2024-03-30: qty 15

## 2024-03-30 MED ORDER — ORAL CARE MOUTH RINSE: 15.0000 mL | Freq: Once | OROMUCOSAL | Status: AC

## 2024-03-30 MED ORDER — DEXMEDETOMIDINE HCL IN NACL 80 MCG/20ML IV SOLN
INTRAVENOUS | Status: DC | PRN
  Administered 2024-03-30: 20 ug via INTRAVENOUS

## 2024-03-30 MED ORDER — OXYMETAZOLINE HCL 0.05 % NA SOLN
NASAL | Status: AC
  Filled 2024-03-30: qty 30

## 2024-03-30 MED ORDER — DEXAMETHASONE SODIUM PHOSPHATE 10 MG/ML IJ SOLN
INTRAMUSCULAR | Status: DC | PRN
  Administered 2024-03-30: 10 mg via INTRAVENOUS

## 2024-03-30 MED ORDER — ROCURONIUM BROMIDE 10 MG/ML (PF) SYRINGE
PREFILLED_SYRINGE | INTRAVENOUS | Status: AC
Start: 2024-03-30 — End: 2024-03-30
  Filled 2024-03-30: qty 10

## 2024-03-30 MED ORDER — OXYCODONE HCL 5 MG/5ML PO SOLN: 5.0000 mg | Freq: Once | ORAL | Status: AC | PRN

## 2024-03-30 MED ORDER — PROPOFOL 1000 MG/100ML IV EMUL
INTRAVENOUS | Status: AC
  Filled 2024-03-30: qty 100

## 2024-03-30 MED ORDER — OXYMETAZOLINE HCL 0.05 % NA SOLN
NASAL | Status: DC | PRN
  Administered 2024-03-30: 1

## 2024-03-30 SURGICAL SUPPLY — 25 items
BAG COUNTER SPONGE SURGICOUNT (BAG) ×1 IMPLANT
CANISTER SUCTION 3000ML PPV (SUCTIONS) ×1 IMPLANT
COAGULATOR SUCT SWTCH 10FR 6 (ELECTROSURGICAL) ×1 IMPLANT
COVER SURGICAL LIGHT HANDLE (MISCELLANEOUS) IMPLANT
DEFOGGER MIRROR 1QT (MISCELLANEOUS) IMPLANT
DRAPE HALF SHEET 40X57 (DRAPES) IMPLANT
ELECTRODE REM PT RTRN 9FT ADLT (ELECTROSURGICAL) ×1 IMPLANT
GAUZE SPONGE 2X2 8PLY STRL LF (GAUZE/BANDAGES/DRESSINGS) ×1 IMPLANT
GLOVE ECLIPSE 7.5 STRL STRAW (GLOVE) ×1 IMPLANT
GOWN STRL REUS W/ TWL LRG LVL3 (GOWN DISPOSABLE) ×2 IMPLANT
KIT BASIN OR (CUSTOM PROCEDURE TRAY) ×1 IMPLANT
KIT TURNOVER KIT B (KITS) ×1 IMPLANT
NDL HYPO 25GX1X1/2 BEV (NEEDLE) ×1 IMPLANT
NEEDLE HYPO 25GX1X1/2 BEV (NEEDLE) ×1 IMPLANT
NS IRRIG 1000ML POUR BTL (IV SOLUTION) ×1 IMPLANT
PAD ARMBOARD POSITIONER FOAM (MISCELLANEOUS) ×2 IMPLANT
SPLINT NASAL DOYLE BI-VL (GAUZE/BANDAGES/DRESSINGS) ×1 IMPLANT
SPONGE NEURO XRAY DETECT 1X3 (DISPOSABLE) ×1 IMPLANT
SUT CHROMIC 4 0 SH 27 (SUTURE) ×1 IMPLANT
SUT PLAIN 4 0 ~~LOC~~ 1 (SUTURE) ×1 IMPLANT
SUT PROLENE 3 0 PS 2 (SUTURE) ×1 IMPLANT
TRAY ENT MC OR (CUSTOM PROCEDURE TRAY) ×1 IMPLANT
TUBE SALEM SUMP 16F (TUBING) IMPLANT
TUBING EXTENTION W/L.L. (IV SETS) IMPLANT
WATER STERILE IRR 1000ML POUR (IV SOLUTION) IMPLANT

## 2024-03-30 NOTE — H&P (Signed)
 CC: Chronic nasal obstruction   HPI:  Richard Kelley is a 23 y.o. male who presents today with his father for evaluation of his chronic nasal obstruction. - Presents for evaluation of nasal obstruction, which has been present for 5+ years. - Reports two past incidents of nasal trauma: one in elementary school from self-inflicted impact causing bleeding, and another from being hit with a football a few years later.  - Has tried Flonase nasal spray in the past but found it did not provide significant relief. Currently takes Zyrtec 24-hour for seasonal allergies. - Reports no recent sinus infections. - Previously seen in 2021 and diagnosed with a deviated septum. - Recently diagnosed with obstructive sleep apnea following both an at-home and in-house sleep study. The initial study showed approximately 70 obstructive events per hour. A subsequent study with a CPAP machine showed that obstructive events were almost non-existent, but approximately 20 central apnea events per hour persisted. - Awaiting his CPAP machine. - No prior history of ear, nose, or throat surgery. - Denies being on any blood thinners.       Past Medical History:  Diagnosis Date   ADHD     Anxiety     Asthma     Hypertension     Sleep apnea                 Past Surgical History:  Procedure Laterality Date   ESOPHAGEAL DILATION N/A 01/31/2024    Procedure: DILATION, ESOPHAGUS;  Surgeon: Cindie Carlin POUR, DO;  Location: [facility];  Service: Endoscopy;  Laterality: N/A;   ESOPHAGOGASTRODUODENOSCOPY N/A 01/31/2024    Procedure: EGD (ESOPHAGOGASTRODUODENOSCOPY);  Surgeon: Cindie Carlin POUR, DO;  Location: [facility];  Service: Endoscopy;  Laterality: N/A;  11:30 am, asa 2   No prior surgery                   Family History  Problem Relation Age of Onset   Irregular heart beat Mother     Irregular heart beat Father     Hypertension Maternal Grandmother     Alpha-1 antitrypsin deficiency Paternal Grandfather      Cirrhosis Paternal Grandfather            Social History:  reports that he has never smoked. He has never used smokeless tobacco. He reports that he does not drink alcohol and does not use drugs.   Allergies:  Allergies  No Known Allergies            Prior to Admission medications   Medication Sig Start Date End Date Taking? Authorizing Provider  acetaminophen (TYLENOL) 500 MG tablet Take 500 mg by mouth every 6 (six) hours as needed (for pain.).     Yes [provider]  albuterol (VENTOLIN HFA) 108 (90 Base) MCG/ACT inhaler Inhale 1-2 puffs into the lungs every 6 (six) hours as needed for wheezing or shortness of breath.     Yes [provider]  calcium carbonate (TUMS - DOSED IN MG ELEMENTAL CALCIUM) 500 MG chewable tablet Chew 2 tablets by mouth daily as needed for indigestion or heartburn.     Yes [provider]  cetirizine (ZYRTEC) 10 MG chewable tablet Chew 10 mg by mouth daily.     Yes [provider]  cloNIDine  (CATAPRES ) 0.2 MG tablet Take 1 tablet twice a day by oral route for 90 days. 12/27/23   Yes [provider]  dexmethylphenidate  (FOCALIN  XR) 20 MG 24 hr capsule Take  1 capsule (20 mg total) by mouth daily. 10/08/22   Yes Hurst, Verneita T, PA-C  escitalopram  (LEXAPRO ) 20 MG tablet Take 1 tablet (20 mg total) by mouth at bedtime. 10/08/22   Yes Rhys Verneita T, PA-C  ibuprofen (ADVIL) 200 MG tablet Take 400 mg by mouth every 8 (eight) hours as needed (for pain.).     Yes [provider]  pantoprazole  (PROTONIX ) 40 MG tablet Take 1 tablet (40 mg total) by mouth 2 (two) times daily. 01/31/24 01/30/25 Yes Carver, Carlin POUR, DO      Blood pressure 127/82, pulse 90, height 5' 11 (1.803 m), weight 280 lb (127 kg), SpO2 96%. Exam: General: Communicates without difficulty, well nourished, no acute distress. Head: Normocephalic, no evidence injury, no tenderness, facial buttresses intact without stepoff. Face/sinus: No tenderness to  palpation and percussion. Facial movement is normal and symmetric. Eyes: PERRL, EOMI. No scleral icterus, conjunctivae clear. Neuro: CN II exam reveals vision grossly intact.  No nystagmus at any point of gaze. Ears: Auricles well formed without lesions.  Ear canals are intact without mass or lesion.  No erythema or edema is appreciated.  The TMs are intact without fluid. Nose: External evaluation reveals normal support and skin without lesions.  Dorsum is intact.  Anterior rhinoscopy reveals congested mucosa over anterior aspect of inferior turbinates and intact septum.  No purulence noted. Oral:  Oral cavity and oropharynx are intact, symmetric, without erythema or edema.  Mucosa is moist without lesions. Neck: Full range of motion without pain.  There is no significant lymphadenopathy.  No masses palpable.  Thyroid  bed within normal limits to palpation.  Parotid glands and submandibular glands equal bilaterally without mass.  Trachea is midline. Neuro:  CN 2-12 grossly intact.    Assessment: - Severe nasal obstruction secondary to deviated nasal septum with a bony spur and bilateral inferior turbinate hypertrophy.  More than 95% of his nasal passageways are obstructed bilaterally. - Obstructive sleep apnea.   Plan: - The physical exam and nasal endoscopy findings are reviewed with the patient. - Continue with his current allergy treatment regimen.  Nasal saline irrigation is encouraged. - The treatment options are extensively discussed.  The options include continuing conservative observation with medical therapy versus surgical intervention with septoplasty and turbinate reduction. - The risks, benefits, alternatives, and details of the surgical options are extensively reviewed.  It was explained that the surgery is an outpatient procedure lasting approximately 45-60 minutes, with all incisions made internally. Post-operatively, splints will be placed inside the nose for about three days, after which  they will be removed in the office. It was clarified that no nasal packing will be used. - The patient is interested in proceeding with the procedures.

## 2024-03-30 NOTE — Transfer of Care (Signed)
 Immediate Anesthesia Transfer of Care Note  Patient: Richard Kelley  Procedure(s) Performed: SEPTOPLASTY, NOSE, WITH NASAL TURBINATE REDUCTION (Bilateral: Nose)  Patient Location: PACU  Anesthesia Type:General  Level of Consciousness: awake, drowsy, and patient cooperative  Airway & Oxygen Therapy: Patient Spontanous Breathing and Patient connected to face mask oxygen  Post-op Assessment: Report given to RN and Post -op Vital signs reviewed and stable  Post vital signs: Reviewed and stable  Last Vitals:  Vitals Value Taken Time  BP 102/56 03/30/24 14:54  Temp 36.3 C 03/30/24 14:53  Pulse 85 03/30/24 15:00  Resp 12 03/30/24 15:00  SpO2 95 % 03/30/24 15:00  Vitals shown include unfiled device data.  Last Pain:  Vitals:   03/30/24 1453  TempSrc:   PainSc: Asleep         Complications: No notable events documented.

## 2024-03-30 NOTE — Anesthesia Procedure Notes (Signed)
 Procedure Name: Intubation Date/Time: 03/30/2024 1:32 PM  Performed by: Dianne Burnard RAMAN, CRNAPre-anesthesia Checklist: Patient identified, Patient being monitored, Timeout performed, Emergency Drugs available and Suction available Patient Re-evaluated:Patient Re-evaluated prior to induction Oxygen Delivery Method: Circle system utilized Preoxygenation: Pre-oxygenation with 100% oxygen Induction Type: IV induction Ventilation: Mask ventilation without difficulty Laryngoscope Size: McGrath and 4 Grade View: Grade I Tube type: Oral Tube size: 7.0 mm Number of attempts: 1 Airway Equipment and Method: Stylet Placement Confirmation: ETT inserted through vocal cords under direct vision, positive ETCO2 and breath sounds checked- equal and bilateral Secured at: 24 cm Tube secured with: Tape Dental Injury: Teeth and Oropharynx as per pre-operative assessment

## 2024-03-30 NOTE — Anesthesia Preprocedure Evaluation (Signed)
 Anesthesia Evaluation  Patient identified by MRN, date of birth, ID band Patient awake    Reviewed: Allergy & Precautions, NPO status , Patient's Chart, lab work & pertinent test results, reviewed documented beta blocker date and time   History of Anesthesia Complications (+) Family history of anesthesia reactionNegative for: history of anesthetic complications  Airway Mallampati: II  TM Distance: >3 FB     Dental no notable dental hx.    Pulmonary neg shortness of breath, asthma , sleep apnea , neg COPD Obstructive and central apnea   breath sounds clear to auscultation       Cardiovascular hypertension, (-) CAD, (-) Past MI and (-) Cardiac Stents  Rhythm:Regular Rate:Normal     Neuro/Psych neg Seizures PSYCHIATRIC DISORDERS Anxiety Depression       GI/Hepatic ,neg GERD  ,,(+) neg Cirrhosis        Endo/Other    Renal/GU Renal disease     Musculoskeletal   Abdominal   Peds  Hematology   Anesthesia Other Findings   Reproductive/Obstetrics                              Anesthesia Physical Anesthesia Plan  ASA: 2  Anesthesia Plan: General   Post-op Pain Management:    Induction: Intravenous  PONV Risk Score and Plan: 2 and Ondansetron, Dexamethasone and Propofol  infusion  Airway Management Planned: Oral ETT  Additional Equipment:   Intra-op Plan:   Post-operative Plan: Extubation in OR  Informed Consent: I have reviewed the patients History and Physical, chart, labs and discussed the procedure including the risks, benefits and alternatives for the proposed anesthesia with the patient or authorized representative who has indicated his/her understanding and acceptance.     Dental advisory given  Plan Discussed with: CRNA  Anesthesia Plan Comments:          Anesthesia Quick Evaluation

## 2024-03-30 NOTE — Op Note (Signed)
 DATE OF PROCEDURE: 03/30/2024  OPERATIVE REPORT   SURGEON: Daniel Moccasin, MD   PREOPERATIVE DIAGNOSES:  1. Severe nasal septal deviation.  2. Bilateral inferior turbinate hypertrophy.  3. Chronic nasal obstruction.  POSTOPERATIVE DIAGNOSES:  1. Severe nasal septal deviation.  2. Bilateral inferior turbinate hypertrophy.  3. Chronic nasal obstruction.  PROCEDURE PERFORMED:  1. Septoplasty.  2. Bilateral partial inferior turbinate resection.   ANESTHESIA: General endotracheal tube anesthesia.   COMPLICATIONS: None.   ESTIMATED BLOOD LOSS: 100 mL.   INDICATION FOR PROCEDURE: Richard Kelley is a 23 y.o. male with a history of chronic nasal obstruction. The patient was treated with antihistamine, decongestant, and steroid nasal sprays. However, the patient continued to be symptomatic. On examination, the patient was noted to have bilateral severe inferior turbinate hypertrophy and significant nasal septal deviation, causing significant nasal obstruction. Based on the above findings, the decision was made for the patient to undergo the above-stated procedures. The risks, benefits, alternatives, and details of the procedures were discussed with the patient. Questions were invited and answered. Informed consent was obtained.   DESCRIPTION OF PROCEDURE: The patient was taken to the operating room and placed supine on the operating table. General endotracheal tube anesthesia was administered by the anesthesiologist. The patient was positioned, and prepped and draped in the standard fashion for nasal surgery. Pledgets soaked with Afrin were placed in both nasal cavities for decongestion. The pledgets were subsequently removed.   Examination of the nasal cavity revealed a severe nasal septal deviation. 1% lidocaine  with 1:100,000 epinephrine was injected onto the nasal septum bilaterally. A hemitransfixion incision was made on the left side. The mucosal flap was carefully elevated on the left side. A  cartilaginous incision was made 1 cm superior to the caudal margin of the nasal septum. Mucosal flap was also elevated on the right side in the similar fashion. It should be noted that due to the severe septal deviation, the deviated portion of the cartilaginous and bony septum had to be removed in piecemeal fashion. Once the deviated portions were removed, a straight midline septum was achieved. The septum was then quilted with 4-0 plain gut sutures. The hemitransfixion incision was closed with interrupted 4-0 chromic sutures.   The inferior one half of both hypertrophied inferior turbinate was crossclamped with a Kelly clamp. The inferior one half of each inferior turbinate was then resected with a pair of cross cutting scissors. Hemostasis was achieved with a suction cautery device.  Doyle splints were applied to the nasal septum.  The care of the patient was turned over to the anesthesiologist. The patient was awakened from anesthesia without difficulty. The patient was extubated and transferred to the recovery room in good condition.   OPERATIVE FINDINGS: Severe nasal septal deviation and bilateral inferior turbinate hypertrophy.   SPECIMEN: None.   FOLLOWUP CARE: The patient be discharged home once he is awake and alert. The patient will follow up in my office in 3 days for splint removal.   Finlee Concepcion Dois Moccasin, MD

## 2024-03-30 NOTE — Anesthesia Postprocedure Evaluation (Signed)
 Anesthesia Post Note  Patient: Richard Kelley  Procedure(s) Performed: SEPTOPLASTY, NOSE, WITH NASAL TURBINATE REDUCTION (Bilateral: Nose)     Patient location during evaluation: PACU Anesthesia Type: General Level of consciousness: awake and alert and oriented Pain management: pain level controlled Vital Signs Assessment: post-procedure vital signs reviewed and stable Respiratory status: spontaneous breathing, nonlabored ventilation and respiratory function stable Cardiovascular status: blood pressure returned to baseline and stable Postop Assessment: no apparent nausea or vomiting Anesthetic complications: no   No notable events documented.  Last Vitals:  Vitals:   03/30/24 1515 03/30/24 1523  BP: 102/68 109/67  Pulse: 91 87  Resp: 18 15  Temp:  36.5 C  SpO2: 92% 94%    Last Pain:  Vitals:   03/30/24 1514  TempSrc:   PainSc: 4    Pain Goal:                   Hansford Hirt A.

## 2024-03-30 NOTE — Discharge Instructions (Signed)

## 2024-03-31 ENCOUNTER — Encounter (HOSPITAL_COMMUNITY): Payer: Self-pay | Admitting: Otolaryngology

## 2024-04-02 ENCOUNTER — Ambulatory Visit (INDEPENDENT_AMBULATORY_CARE_PROVIDER_SITE_OTHER): Admitting: Otolaryngology

## 2024-04-02 ENCOUNTER — Encounter (INDEPENDENT_AMBULATORY_CARE_PROVIDER_SITE_OTHER): Payer: Self-pay | Admitting: Otolaryngology

## 2024-04-02 VITALS — BP 129/85 | HR 68

## 2024-04-02 DIAGNOSIS — Z9889 Other specified postprocedural states: Secondary | ICD-10-CM

## 2024-04-02 DIAGNOSIS — J310 Chronic rhinitis: Secondary | ICD-10-CM

## 2024-04-02 NOTE — Progress Notes (Signed)
 Doyle splints removed. Septum and turbinates are healing well.   Both [place] debrided.  Nasal saline irrigation.  Recheck in 3 weeks.

## 2024-04-24 ENCOUNTER — Encounter (INDEPENDENT_AMBULATORY_CARE_PROVIDER_SITE_OTHER): Payer: Self-pay | Admitting: Otolaryngology

## 2024-04-24 ENCOUNTER — Ambulatory Visit (INDEPENDENT_AMBULATORY_CARE_PROVIDER_SITE_OTHER): Admitting: Otolaryngology

## 2024-04-24 VITALS — Ht 71.0 in | Wt 280.0 lb

## 2024-04-24 DIAGNOSIS — J310 Chronic rhinitis: Secondary | ICD-10-CM

## 2024-04-24 DIAGNOSIS — Z9889 Other specified postprocedural states: Secondary | ICD-10-CM

## 2024-04-24 NOTE — Progress Notes (Signed)
 Septum and turbinates are healing well.   Both [place] debrided.   Nasal saline irrigation as needed.   Recheck in 6 months.

## 2024-05-02 ENCOUNTER — Encounter: Payer: Self-pay | Admitting: Physician Assistant

## 2024-05-02 ENCOUNTER — Ambulatory Visit: Admitting: Physician Assistant

## 2024-05-02 VITALS — BP 135/86 | HR 98

## 2024-05-02 DIAGNOSIS — F902 Attention-deficit hyperactivity disorder, combined type: Secondary | ICD-10-CM

## 2024-05-02 DIAGNOSIS — F4010 Social phobia, unspecified: Secondary | ICD-10-CM | POA: Diagnosis not present

## 2024-05-02 DIAGNOSIS — F422 Mixed obsessional thoughts and acts: Secondary | ICD-10-CM | POA: Diagnosis not present

## 2024-05-02 MED ORDER — CLONIDINE HCL 0.3 MG PO TABS: 0.3000 mg | ORAL_TABLET | Freq: Every evening | ORAL | 2 refills | Status: DC

## 2024-05-02 MED ORDER — DEXMETHYLPHENIDATE HCL ER 35 MG PO CP24: 35.0000 mg | ORAL_CAPSULE | Freq: Every morning | ORAL | 0 refills | Status: DC

## 2024-05-02 NOTE — Progress Notes (Signed)
 [facility]  Patient ID: Richard Kelley,  MRN: [id number]  PCP: Shona Norleen PEDLAR, MD  Date of Evaluation: 05/02/2024 Time spent:20 minutes  Chief Complaint:  Chief Complaint   ADHD; Depression; Follow-up    HISTORY/CURRENT STATUS: HPI  For routine f/u  Focalin  was started at [facility]. He feels better, more able to focus and get more done.  He started at [employer], taking 2 classes on campus and 1 is virtual. 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  No extreme sadness, tearfulness, or feelings of hopelessness.  Sleeps well most of the time. ADLs and personal hygiene are normal.   Appetite has not changed.  Weight is stable.  No c/o anxiety.  No mania, delirium, AH/VH.  No SI/HI.  Individual Medical History/ Review of Systems: Changes? :Yes  Having a sleep study soon.  Had deviated septum repair. Dx w/ pre-DM.   Past medications for mental health diagnoses include: Adderall, Vyvanse caused depression, Ritalin, Lexapro , Wellbutrin made him dazed and disoriented, Clonodine, Perphenazine   Allergies: Patient has no known allergies.  Current Medications:  Current Outpatient Medications:    albuterol (VENTOLIN HFA) 108 (90 Base) MCG/ACT inhaler, Inhale 1-2 puffs into the lungs every 6 (six) hours as needed for wheezing or shortness of breath., Disp: , Rfl:    cetirizine (ZYRTEC) 10 MG chewable tablet, Chew 10 mg by mouth daily. 24 hour, Disp: , Rfl:    cloNIDine  (CATAPRES ) 0.3 MG tablet, Take 1 tablet (0.3 mg total) by mouth at bedtime., Disp: 30 tablet, Rfl: 2   [START ON 05/31/2024] Dexmethylphenidate  HCl 35 MG CP24, Take 35 mg by mouth in the morning., Disp: 30 capsule, Rfl: 0   Dexmethylphenidate  HCl 35 MG CP24, Take 35 mg by mouth in the morning., Disp: 30 capsule, Rfl: 0   escitalopram  (LEXAPRO ) 20 MG tablet, Take 1 tablet (20 mg total) by mouth at bedtime. (Patient taking differently: Take 20 mg by mouth at bedtime. He takes it prn), Disp: 90 tablet, Rfl:  1   pantoprazole  (PROTONIX ) 40 MG tablet, Take 1 tablet (40 mg total) by mouth 2 (two) times daily., Disp: 60 tablet, Rfl: 11   [START ON 06/30/2024] Dexmethylphenidate  HCl 35 MG CP24, Take 35 mg by mouth in the morning., Disp: 30 capsule, Rfl: 0 Medication Side Effects: none  Family Medical/ Social History: Changes?  No  MENTAL HEALTH EXAM:  Blood pressure 135/86, pulse 98.There is no height or weight on file to calculate BMI.  General Appearance: Casual, Well Groomed, and Obese  Eye Contact:  Good  Speech:  Clear and Coherent and Normal Rate  Volume:  Normal  Mood:  Euthymic  Affect:  Congruent  Thought Process:  Goal Directed and Descriptions of Associations: Circumstantial  Orientation:  Full (Time, Place, and Person)  Thought Content: Logical   Suicidal Thoughts:  No  Homicidal Thoughts:  No  Memory:  WNL  Judgement:  Good  Insight:  Good  Psychomotor Activity:  Normal  Concentration:  Concentration: Good and Attention Span: Good  Recall:  Good  Fund of Knowledge: Good  Language: Good  Assets:  Desire for Improvement Financial Resources/Insurance Housing Transportation Vocational/Educational  ADL's:  Intact  Cognition: WNL  Prognosis:  Good   Labs 03/30/2024 CBC and BMP reviewed  DIAGNOSES:    ICD-10-CM   1. Attention deficit hyperactivity disorder, combined type  F90.2     2. Mixed obsessional thoughts and acts  F42.2     3. Social anxiety  disorder  F40.10       Receiving Psychotherapy: No   RECOMMENDATIONS:  PDMP was reviewed.  Last Focalin  filled 03/20/2024. I provided approximately 20  minutes of face to face time during this encounter, including time spent before and after the visit in records review, medical decision making, counseling pertinent to today's visit, and charting.   He's doing well so no changes are needed.   Continue Clonidine  0.3 mg, 1 at bedtime.  Cont Focalin  XR  35 mg, 1 p.o. every morning. Continue Lexapro  20 mg, 1 po qhs. (He  takes It prn.  Recommend taking daily.) Return in 3 months.    Verneita Cooks, PA-C

## 2024-06-15 ENCOUNTER — Other Ambulatory Visit: Payer: Self-pay | Admitting: Gastroenterology

## 2024-06-15 DIAGNOSIS — K219 Gastro-esophageal reflux disease without esophagitis: Secondary | ICD-10-CM

## 2024-06-22 ENCOUNTER — Telehealth: Payer: Self-pay | Admitting: *Deleted

## 2024-06-22 ENCOUNTER — Other Ambulatory Visit: Payer: Self-pay | Admitting: Gastroenterology

## 2024-06-22 DIAGNOSIS — K219 Gastro-esophageal reflux disease without esophagitis: Secondary | ICD-10-CM

## 2024-06-22 MED ORDER — PANTOPRAZOLE SODIUM 40 MG PO TBEC
40.0000 mg | DELAYED_RELEASE_TABLET | Freq: Two times a day (BID) | ORAL | 11 refills | Status: AC
Start: 2024-06-22 — End: 2025-06-22

## 2024-06-22 NOTE — Telephone Encounter (Signed)
 Received refill request for pantoprazole . Pt last OV 01/04/2024

## 2024-07-12 ENCOUNTER — Encounter: Payer: Self-pay | Admitting: Internal Medicine

## 2024-07-31 ENCOUNTER — Ambulatory Visit (INDEPENDENT_AMBULATORY_CARE_PROVIDER_SITE_OTHER): Admitting: Physician Assistant

## 2024-07-31 ENCOUNTER — Encounter: Payer: Self-pay | Admitting: Physician Assistant

## 2024-07-31 DIAGNOSIS — F902 Attention-deficit hyperactivity disorder, combined type: Secondary | ICD-10-CM | POA: Diagnosis not present

## 2024-07-31 MED ORDER — DEXMETHYLPHENIDATE HCL ER 35 MG PO CP24: 35.0000 mg | ORAL_CAPSULE | Freq: Every morning | ORAL | 0 refills | Status: AC

## 2024-07-31 MED ORDER — CLONIDINE HCL 0.3 MG PO TABS: 0.3000 mg | ORAL_TABLET | Freq: Every evening | ORAL | 2 refills | Status: AC

## 2024-07-31 NOTE — Progress Notes (Signed)
 [facility]  Patient ID: Richard Kelley,  MRN: [id number]  PCP: Shona Norleen PEDLAR, MD  Date of Evaluation: 07/31/2024 Time spent:20 minutes  Chief Complaint:  Chief Complaint   ADHD; Depression; Follow-up    HISTORY/CURRENT STATUS: HPI  For routine f/u  Has trouble getting the Focalin , it's back-ordered a lot of the time.  It's very effective when he takes it.  He had old Rx of 20 mg that he's taking and it helps.  But not as good as the 35 mg.  Is finishing the semester and grades are good.   He's not having any sx of depression. He still only takes the lexapro  prn.  Feels like it causes dissociation sometimes when he takes it, therefore he does not want to.  Energy and motivation are good.  No sadness, tearfulness, or feelings of hopelessness.  Sleeps well. ADLs and personal hygiene are normal.   Appetite has not changed.  Weight is stable.  Denies laxative use, calorie restricting, or binging and purging.   Denies cutting or any form of self-harm.  No reports of anxiety.  No mania, delirium, AH/VH.  No SI/HI.  Individual Medical History/ Review of Systems: Changes? :Yes   Dx w/ central sleep apnea. Has ASV.  Glucoses have been more normal   Past medications for mental health diagnoses include: Adderall, Vyvanse caused depression, Ritalin, Lexapro , Wellbutrin made him dazed and disoriented, Clonodine, Perphenazine   Allergies: Patient has no known allergies.  Current Medications:  Current Outpatient Medications:    pantoprazole  (PROTONIX ) 40 MG tablet, Take 1 tablet (40 mg total) by mouth 2 (two) times daily., Disp: 60 tablet, Rfl: 11   albuterol (VENTOLIN HFA) 108 (90 Base) MCG/ACT inhaler, Inhale 1-2 puffs into the lungs every 6 (six) hours as needed for wheezing or shortness of breath., Disp: , Rfl:    cetirizine (ZYRTEC) 10 MG chewable tablet, Chew 10 mg by mouth daily. 24 hour, Disp: , Rfl:    cloNIDine  (CATAPRES ) 0.3 MG tablet, Take 1 tablet (0.3 mg total) by mouth at  bedtime., Disp: 30 tablet, Rfl: 2   [START ON 09/25/2024] Dexmethylphenidate  HCl 35 MG CP24, Take 35 mg by mouth in the morning., Disp: 30 capsule, Rfl: 0   Dexmethylphenidate  HCl 35 MG CP24, Take 35 mg by mouth in the morning., Disp: 30 capsule, Rfl: 0   [START ON 08/28/2024] Dexmethylphenidate  HCl 35 MG CP24, Take 35 mg by mouth in the morning., Disp: 30 capsule, Rfl: 0 Medication Side Effects: none  Family Medical/ Social History: Changes?  No  MENTAL HEALTH EXAM:  There were no vitals taken for this visit.There is no height or weight on file to calculate BMI.  General Appearance: Casual, Well Groomed, and Obese  Eye Contact:  Good  Speech:  Clear and Coherent and Normal Rate  Volume:  Normal  Mood:  Euthymic  Affect:  Congruent  Thought Process:  Goal Directed and Descriptions of Associations: Circumstantial  Orientation:  Full (Time, Place, and Person)  Thought Content: Logical   Suicidal Thoughts:  No  Homicidal Thoughts:  No  Memory:  WNL  Judgement:  Good  Insight:  Good  Psychomotor Activity:  Normal  Concentration:  Concentration: Fair and Attention Span: Good  Recall:  Good  Fund of Knowledge: Good  Language: Good  Assets:  Desire for Improvement Financial Resources/Insurance Housing Transportation Vocational/Educational  ADL's:  Intact  Cognition: WNL  Prognosis:  Good   DIAGNOSES:    ICD-10-CM   1. Attention deficit  hyperactivity disorder, combined type  F90.2       Receiving Psychotherapy: No   RECOMMENDATIONS:  PDMP was reviewed.  Last Focalin  filled 06/15/2024. I provided approximately 20 minutes of face to face time during this encounter, including time spent before and after the visit in records review, medical decision making, counseling pertinent to today's visit, and charting.   Since he is not taking the Lexapro  routinely I recommend he just stop it.  He cannot remember the last time he took it so he does not need to wean off.  No other  changes.  Continue Clonidine  0.3 mg, 1 at bedtime.  Cont Focalin  XR  35 mg, 1 p.o. every morning. Return in 6 months.    Verneita Cooks, PA-C

## 2024-10-24 ENCOUNTER — Ambulatory Visit (INDEPENDENT_AMBULATORY_CARE_PROVIDER_SITE_OTHER): Admitting: Otolaryngology

## 2025-01-30 ENCOUNTER — Ambulatory Visit: Admitting: Physician Assistant
